# Patient Record
Sex: Female | Born: 1980 | Race: White | Hispanic: No | Marital: Married | Smoking: Never smoker
Health system: Southern US, Community
[De-identification: ages and names within clinical notes are randomized; demographics above are authoritative.]

## PROBLEM LIST (undated history)

## (undated) ENCOUNTER — Inpatient Hospital Stay (HOSPITAL_COMMUNITY): Payer: Self-pay

## (undated) DIAGNOSIS — F32A Depression, unspecified: Secondary | ICD-10-CM

## (undated) DIAGNOSIS — T4145XA Adverse effect of unspecified anesthetic, initial encounter: Secondary | ICD-10-CM

## (undated) DIAGNOSIS — F329 Major depressive disorder, single episode, unspecified: Secondary | ICD-10-CM

## (undated) DIAGNOSIS — T8859XA Other complications of anesthesia, initial encounter: Secondary | ICD-10-CM

---
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
## 2004-10-23 ENCOUNTER — Ambulatory Visit: Payer: Self-pay | Admitting: Psychology

## 2008-07-24 ENCOUNTER — Inpatient Hospital Stay (HOSPITAL_COMMUNITY): Admission: AD | Admit: 2008-07-24 | Discharge: 2008-07-24 | Payer: Self-pay | Admitting: Obstetrics and Gynecology

## 2009-02-07 ENCOUNTER — Inpatient Hospital Stay (HOSPITAL_COMMUNITY): Admission: AD | Admit: 2009-02-07 | Discharge: 2009-02-09 | Payer: Self-pay | Admitting: Obstetrics and Gynecology

## 2010-06-02 ENCOUNTER — Inpatient Hospital Stay (HOSPITAL_COMMUNITY): Admission: AD | Admit: 2010-06-02 | Discharge: 2010-06-03 | Payer: Self-pay | Admitting: Obstetrics & Gynecology

## 2011-02-24 LAB — CBC
HCT: 35 % — ABNORMAL LOW (ref 36.0–46.0)
Hemoglobin: 12 g/dL (ref 12.0–15.0)
MCH: 32.8 pg (ref 26.0–34.0)
MCHC: 34.2 g/dL (ref 30.0–36.0)
MCV: 95.9 fL (ref 78.0–100.0)
Platelets: 115 10*3/uL — ABNORMAL LOW (ref 150–400)
RBC: 3.65 MIL/uL — ABNORMAL LOW (ref 3.87–5.11)
RDW: 12.8 % (ref 11.5–15.5)
WBC: 11 10*3/uL — ABNORMAL HIGH (ref 4.0–10.5)

## 2011-02-24 LAB — RPR: RPR Ser Ql: NONREACTIVE

## 2011-03-21 LAB — RPR: RPR Ser Ql: NONREACTIVE

## 2011-03-21 LAB — CBC
HCT: 37.2 % (ref 36.0–46.0)
Hemoglobin: 12.5 g/dL (ref 12.0–15.0)
MCHC: 33.7 g/dL (ref 30.0–36.0)
MCV: 93.5 fL (ref 78.0–100.0)
MCV: 94.1 fL (ref 78.0–100.0)
Platelets: 112 10*3/uL — ABNORMAL LOW (ref 150–400)
RBC: 3.32 MIL/uL — ABNORMAL LOW (ref 3.87–5.11)
RBC: 3.98 MIL/uL (ref 3.87–5.11)
RDW: 13.8 % (ref 11.5–15.5)
WBC: 9.7 10*3/uL (ref 4.0–10.5)
WBC: 9.9 10*3/uL (ref 4.0–10.5)

## 2014-12-09 NOTE — L&D Delivery Note (Signed)
Pt was admitted for induction. She had AROM with clear fluid. She progressed along a nl labor curve. She pushed for 45 min and had a SVD of one live viable white female infant over an intact perineum. Placenta -S/I. EBL-400cc. Baby to NBN.

## 2015-04-03 LAB — OB RESULTS CONSOLE GC/CHLAMYDIA
CHLAMYDIA, DNA PROBE: NEGATIVE
Gonorrhea: NEGATIVE

## 2015-04-03 LAB — OB RESULTS CONSOLE ABO/RH: RH Type: POSITIVE

## 2015-04-03 LAB — OB RESULTS CONSOLE ANTIBODY SCREEN: Antibody Screen: NEGATIVE

## 2015-04-03 LAB — OB RESULTS CONSOLE RUBELLA ANTIBODY, IGM: RUBELLA: IMMUNE

## 2015-04-03 LAB — OB RESULTS CONSOLE RPR: RPR: NONREACTIVE

## 2015-04-03 LAB — OB RESULTS CONSOLE HIV ANTIBODY (ROUTINE TESTING): HIV: NONREACTIVE

## 2015-04-03 LAB — OB RESULTS CONSOLE HEPATITIS B SURFACE ANTIGEN: HEP B S AG: NEGATIVE

## 2015-04-27 ENCOUNTER — Other Ambulatory Visit (HOSPITAL_COMMUNITY): Payer: Self-pay | Admitting: Obstetrics & Gynecology

## 2015-04-27 DIAGNOSIS — O283 Abnormal ultrasonic finding on antenatal screening of mother: Secondary | ICD-10-CM

## 2015-05-05 ENCOUNTER — Encounter (HOSPITAL_COMMUNITY): Payer: Self-pay

## 2015-05-05 ENCOUNTER — Ambulatory Visit (HOSPITAL_COMMUNITY): Admission: RE | Admit: 2015-05-05 | Payer: BLUE CROSS/BLUE SHIELD | Source: Ambulatory Visit

## 2015-05-05 ENCOUNTER — Ambulatory Visit (HOSPITAL_COMMUNITY)
Admission: RE | Admit: 2015-05-05 | Discharge: 2015-05-05 | Disposition: A | Discharge status: Home / Self Care | Payer: BLUE CROSS/BLUE SHIELD | Source: Ambulatory Visit | Attending: Obstetrics & Gynecology | Admitting: Obstetrics & Gynecology

## 2015-05-05 DIAGNOSIS — O289 Unspecified abnormal findings on antenatal screening of mother: Secondary | ICD-10-CM | POA: Insufficient documentation

## 2015-05-05 DIAGNOSIS — Z3A21 21 weeks gestation of pregnancy: Secondary | ICD-10-CM | POA: Insufficient documentation

## 2015-05-05 DIAGNOSIS — O283 Abnormal ultrasonic finding on antenatal screening of mother: Secondary | ICD-10-CM

## 2015-05-05 DIAGNOSIS — Z3689 Encounter for other specified antenatal screening: Secondary | ICD-10-CM | POA: Insufficient documentation

## 2015-05-05 DIAGNOSIS — O359XX0 Maternal care for (suspected) fetal abnormality and damage, unspecified, not applicable or unspecified: Secondary | ICD-10-CM | POA: Insufficient documentation

## 2015-05-09 ENCOUNTER — Other Ambulatory Visit (HOSPITAL_COMMUNITY): Payer: Self-pay | Admitting: Obstetrics & Gynecology

## 2015-08-11 LAB — OB RESULTS CONSOLE GBS: GBS: NEGATIVE

## 2015-09-11 ENCOUNTER — Inpatient Hospital Stay (HOSPITAL_COMMUNITY)
Admission: AD | Admit: 2015-09-11 | Discharge: 2015-09-11 | Disposition: A | Discharge status: Home / Self Care | Payer: BLUE CROSS/BLUE SHIELD | Source: Ambulatory Visit | Attending: Obstetrics and Gynecology | Admitting: Obstetrics and Gynecology

## 2015-09-11 ENCOUNTER — Encounter (HOSPITAL_COMMUNITY): Payer: Self-pay

## 2015-09-11 DIAGNOSIS — Z3493 Encounter for supervision of normal pregnancy, unspecified, third trimester: Secondary | ICD-10-CM | POA: Insufficient documentation

## 2015-09-11 HISTORY — DX: Depression, unspecified: F32.A

## 2015-09-11 HISTORY — DX: Other complications of anesthesia, initial encounter: T88.59XA

## 2015-09-11 HISTORY — DX: Adverse effect of unspecified anesthetic, initial encounter: T41.45XA

## 2015-09-11 HISTORY — DX: Major depressive disorder, single episode, unspecified: F32.9

## 2015-09-11 NOTE — Progress Notes (Signed)
To Room 174 for Triage

## 2015-09-11 NOTE — Progress Notes (Signed)
Contractions started at 0100.

## 2015-09-14 ENCOUNTER — Telehealth (HOSPITAL_COMMUNITY): Payer: Self-pay | Admitting: *Deleted

## 2015-09-14 ENCOUNTER — Encounter (HOSPITAL_COMMUNITY): Payer: Self-pay | Admitting: *Deleted

## 2015-09-14 NOTE — Telephone Encounter (Signed)
Preadmission screen  

## 2015-09-15 ENCOUNTER — Encounter (HOSPITAL_COMMUNITY): Payer: Self-pay

## 2015-09-15 ENCOUNTER — Inpatient Hospital Stay (HOSPITAL_COMMUNITY): Payer: BLUE CROSS/BLUE SHIELD | Admitting: Anesthesiology

## 2015-09-15 ENCOUNTER — Inpatient Hospital Stay (HOSPITAL_COMMUNITY)
Admission: RE | Admit: 2015-09-15 | Discharge: 2015-09-17 | DRG: 775 | Disposition: A | Discharge status: Home / Self Care | Payer: BLUE CROSS/BLUE SHIELD | Source: Ambulatory Visit | Attending: Obstetrics and Gynecology | Admitting: Obstetrics and Gynecology

## 2015-09-15 DIAGNOSIS — F329 Major depressive disorder, single episode, unspecified: Secondary | ICD-10-CM | POA: Diagnosis present

## 2015-09-15 DIAGNOSIS — Z3480 Encounter for supervision of other normal pregnancy, unspecified trimester: Secondary | ICD-10-CM

## 2015-09-15 DIAGNOSIS — Z3A41 41 weeks gestation of pregnancy: Secondary | ICD-10-CM | POA: Diagnosis not present

## 2015-09-15 DIAGNOSIS — O480 Post-term pregnancy: Principal | ICD-10-CM | POA: Diagnosis not present

## 2015-09-15 DIAGNOSIS — O99344 Other mental disorders complicating childbirth: Secondary | ICD-10-CM | POA: Diagnosis present

## 2015-09-15 LAB — CBC WITH DIFFERENTIAL/PLATELET
Basophils Absolute: 0 10*3/uL (ref 0.0–0.1)
Basophils Relative: 0 %
Eosinophils Absolute: 0 10*3/uL (ref 0.0–0.7)
Eosinophils Relative: 0 %
HCT: 36.2 % (ref 36.0–46.0)
Hemoglobin: 12.2 g/dL (ref 12.0–15.0)
Lymphocytes Relative: 9 %
Lymphs Abs: 1.3 10*3/uL (ref 0.7–4.0)
MCH: 31 pg (ref 26.0–34.0)
MCHC: 33.7 g/dL (ref 30.0–36.0)
MCV: 91.9 fL (ref 78.0–100.0)
Monocytes Absolute: 0.9 10*3/uL (ref 0.1–1.0)
Monocytes Relative: 6 %
Neutro Abs: 12.3 10*3/uL — ABNORMAL HIGH (ref 1.7–7.7)
Neutrophils Relative %: 85 %
Platelets: 111 10*3/uL — ABNORMAL LOW (ref 150–400)
RBC: 3.94 MIL/uL (ref 3.87–5.11)
RDW: 13.6 % (ref 11.5–15.5)
WBC: 14.4 10*3/uL — ABNORMAL HIGH (ref 4.0–10.5)

## 2015-09-15 LAB — CBC
HCT: 35.8 % — ABNORMAL LOW (ref 36.0–46.0)
HEMOGLOBIN: 12.1 g/dL (ref 12.0–15.0)
MCH: 31.1 pg (ref 26.0–34.0)
MCHC: 33.8 g/dL (ref 30.0–36.0)
MCV: 92 fL (ref 78.0–100.0)
PLATELETS: 117 10*3/uL — AB (ref 150–400)
RBC: 3.89 MIL/uL (ref 3.87–5.11)
RDW: 13.7 % (ref 11.5–15.5)
WBC: 8.6 10*3/uL (ref 4.0–10.5)

## 2015-09-15 LAB — TYPE AND SCREEN
ABO/RH(D): O POS
ANTIBODY SCREEN: NEGATIVE

## 2015-09-15 LAB — ABO/RH: ABO/RH(D): O POS

## 2015-09-15 MED ORDER — BENZOCAINE-MENTHOL 20-0.5 % EX AERO: 1.0000 "application " | INHALATION_SPRAY | CUTANEOUS | Status: DC | PRN

## 2015-09-15 MED ORDER — LIDOCAINE HCL (PF) 1 % IJ SOLN
INTRAMUSCULAR | Status: DC | PRN
  Administered 2015-09-15: 5 mL via EPIDURAL
  Administered 2015-09-15: 2 mL via EPIDURAL
  Administered 2015-09-15: 3 mL via EPIDURAL

## 2015-09-15 MED ORDER — EPHEDRINE 5 MG/ML INJ: 10.0000 mg | INTRAVENOUS | Status: DC | PRN

## 2015-09-15 MED ORDER — FENTANYL CITRATE (PF) 100 MCG/2ML IJ SOLN
100.0000 ug | INTRAMUSCULAR | Status: DC | PRN
  Administered 2015-09-15: 100 ug via INTRAVENOUS
  Filled 2015-09-15: qty 2

## 2015-09-15 MED ORDER — IBUPROFEN 600 MG PO TABS
600.0000 mg | ORAL_TABLET | Freq: Four times a day (QID) | ORAL | Status: DC
  Administered 2015-09-15 – 2015-09-16 (×3): 600 mg via ORAL
  Filled 2015-09-15 (×5): qty 1

## 2015-09-15 MED ORDER — OXYTOCIN BOLUS FROM INFUSION: 500.0000 mL | INTRAVENOUS | Status: DC

## 2015-09-15 MED ORDER — LACTATED RINGERS IV SOLN
INTRAVENOUS | Status: DC
  Administered 2015-09-15: 125 mL/h via INTRAVENOUS

## 2015-09-15 MED ORDER — PHENYLEPHRINE 40 MCG/ML (10ML) SYRINGE FOR IV PUSH (FOR BLOOD PRESSURE SUPPORT): 80.0000 ug | PREFILLED_SYRINGE | INTRAVENOUS | Status: DC | PRN

## 2015-09-15 MED ORDER — PRENATAL MULTIVITAMIN CH
1.0000 | ORAL_TABLET | Freq: Every day | ORAL | Status: DC
  Administered 2015-09-15 – 2015-09-16 (×2): 1 via ORAL
  Filled 2015-09-15 (×2): qty 1

## 2015-09-15 MED ORDER — ZOLPIDEM TARTRATE 5 MG PO TABS: 5.0000 mg | ORAL_TABLET | Freq: Every evening | ORAL | Status: DC | PRN

## 2015-09-15 MED ORDER — CITRIC ACID-SODIUM CITRATE 334-500 MG/5ML PO SOLN: 30.0000 mL | ORAL | Status: DC | PRN

## 2015-09-15 MED ORDER — DIPHENHYDRAMINE HCL 50 MG/ML IJ SOLN: 12.5000 mg | INTRAMUSCULAR | Status: DC | PRN

## 2015-09-15 MED ORDER — OXYTOCIN 40 UNITS IN LACTATED RINGERS INFUSION - SIMPLE MED
1.0000 m[IU]/min | INTRAVENOUS | Status: DC
  Administered 2015-09-15: 2 m[IU]/min via INTRAVENOUS

## 2015-09-15 MED ORDER — LACTATED RINGERS IV SOLN: 500.0000 mL | INTRAVENOUS | Status: DC | PRN

## 2015-09-15 MED ORDER — TERBUTALINE SULFATE 1 MG/ML IJ SOLN: 0.2500 mg | Freq: Once | INTRAMUSCULAR | Status: DC | PRN

## 2015-09-15 MED ORDER — SIMETHICONE 80 MG PO CHEW: 80.0000 mg | CHEWABLE_TABLET | ORAL | Status: DC | PRN

## 2015-09-15 MED ORDER — OXYTOCIN 40 UNITS IN LACTATED RINGERS INFUSION - SIMPLE MED
62.5000 mL/h | INTRAVENOUS | Status: DC
  Filled 2015-09-15: qty 1000

## 2015-09-15 MED ORDER — FENTANYL 2.5 MCG/ML BUPIVACAINE 1/10 % EPIDURAL INFUSION (WH - ANES)
INTRAMUSCULAR | Status: AC
  Administered 2015-09-15: 14 mL/h via EPIDURAL
  Filled 2015-09-15: qty 125

## 2015-09-15 MED ORDER — ACETAMINOPHEN 325 MG PO TABS: 650.0000 mg | ORAL_TABLET | ORAL | Status: DC | PRN

## 2015-09-15 MED ORDER — OXYCODONE-ACETAMINOPHEN 5-325 MG PO TABS: 1.0000 | ORAL_TABLET | ORAL | Status: DC | PRN

## 2015-09-15 MED ORDER — MISOPROSTOL 25 MCG QUARTER TABLET
25.0000 ug | ORAL_TABLET | Freq: Once | ORAL | Status: AC
  Administered 2015-09-15: 25 ug via VAGINAL
  Filled 2015-09-15: qty 0.25

## 2015-09-15 MED ORDER — LIDOCAINE HCL (PF) 1 % IJ SOLN
30.0000 mL | INTRAMUSCULAR | Status: DC | PRN
  Filled 2015-09-15: qty 30

## 2015-09-15 MED ORDER — FENTANYL 2.5 MCG/ML BUPIVACAINE 1/10 % EPIDURAL INFUSION (WH - ANES)
14.0000 mL/h | INTRAMUSCULAR | Status: DC | PRN
  Administered 2015-09-15 (×2): 14 mL/h via EPIDURAL

## 2015-09-15 MED ORDER — DIBUCAINE 1 % RE OINT: 1.0000 "application " | TOPICAL_OINTMENT | RECTAL | Status: DC | PRN

## 2015-09-15 MED ORDER — SENNOSIDES-DOCUSATE SODIUM 8.6-50 MG PO TABS
2.0000 | ORAL_TABLET | ORAL | Status: DC
  Administered 2015-09-15 – 2015-09-16 (×2): 2 via ORAL
  Filled 2015-09-15 (×2): qty 2

## 2015-09-15 MED ORDER — MEASLES, MUMPS & RUBELLA VAC ~~LOC~~ INJ: 0.5000 mL | INJECTION | Freq: Once | SUBCUTANEOUS | Status: DC

## 2015-09-15 MED ORDER — PHENYLEPHRINE 40 MCG/ML (10ML) SYRINGE FOR IV PUSH (FOR BLOOD PRESSURE SUPPORT)
PREFILLED_SYRINGE | INTRAVENOUS | Status: AC
  Filled 2015-09-15: qty 20

## 2015-09-15 MED ORDER — TETANUS-DIPHTH-ACELL PERTUSSIS 5-2.5-18.5 LF-MCG/0.5 IM SUSP: 0.5000 mL | Freq: Once | INTRAMUSCULAR | Status: DC

## 2015-09-15 MED ORDER — ONDANSETRON HCL 4 MG/2ML IJ SOLN
4.0000 mg | INTRAMUSCULAR | Status: DC | PRN
Start: 2015-09-15 — End: 2015-09-17

## 2015-09-15 MED ORDER — WITCH HAZEL-GLYCERIN EX PADS: 1.0000 "application " | MEDICATED_PAD | CUTANEOUS | Status: DC | PRN

## 2015-09-15 MED ORDER — OXYCODONE-ACETAMINOPHEN 5-325 MG PO TABS: 2.0000 | ORAL_TABLET | ORAL | Status: DC | PRN

## 2015-09-15 MED ORDER — ONDANSETRON HCL 4 MG/2ML IJ SOLN: 4.0000 mg | Freq: Four times a day (QID) | INTRAMUSCULAR | Status: DC | PRN

## 2015-09-15 MED ORDER — ONDANSETRON HCL 4 MG PO TABS: 4.0000 mg | ORAL_TABLET | ORAL | Status: DC | PRN

## 2015-09-15 NOTE — Anesthesia Preprocedure Evaluation (Addendum)
Anesthesia Evaluation  Patient identified by MRN, date of birth, ID band Patient awake    Reviewed: Allergy & Precautions, NPO status , Patient's Chart, lab work & pertinent test results  History of Anesthesia Complications (+) history of anesthetic complications (patient states last epidural "worked slowly")  Airway Mallampati: II  TM Distance: >3 FB Neck ROM: Full    Dental  (+) Teeth Intact, Dental Advisory Given   Pulmonary neg pulmonary ROS,    Pulmonary exam normal breath sounds clear to auscultation       Cardiovascular Exercise Tolerance: Good negative cardio ROS Normal cardiovascular exam Rhythm:Regular Rate:Normal     Neuro/Psych PSYCHIATRIC DISORDERS Depression negative neurological ROS     GI/Hepatic negative GI ROS, Neg liver ROS,   Endo/Other  negative endocrine ROS  Renal/GU negative Renal ROS     Musculoskeletal negative musculoskeletal ROS (+)   Abdominal   Peds  Hematology negative hematology ROS (+) Blood dyscrasia, anemia , Thrombocytopenia--Platelets 117k on 09/15/15; 115k in 2011    Anesthesia Other Findings Day of surgery medications reviewed with the patient.  Reproductive/Obstetrics (+) Pregnancy                            Anesthesia Physical Anesthesia Plan  ASA: II  Anesthesia Plan: Epidural   Post-op Pain Management:    Induction:   Airway Management Planned:   Additional Equipment:   Intra-op Plan:   Post-operative Plan:   Informed Consent: I have reviewed the patients History and Physical, chart, labs and discussed the procedure including the risks, benefits and alternatives for the proposed anesthesia with the patient or authorized representative who has indicated his/her understanding and acceptance.   Dental advisory given  Plan Discussed with:   Anesthesia Plan Comments: (Patient identified. Risks/Benefits/Options discussed with patient  including but not limited to bleeding, infection, nerve damage, paralysis, failed block, incomplete pain control, headache, blood pressure changes, nausea, vomiting, reactions to medication both or allergic, itching and postpartum back pain. Confirmed with bedside nurse the patient's most recent platelet count. Confirmed with patient that they are not currently taking any anticoagulation, have any bleeding history or any family history of bleeding disorders. Patient expressed understanding and wished to proceed. All questions were answered. )        Anesthesia Quick Evaluation

## 2015-09-15 NOTE — H&P (Signed)
Pt is a 34 y/o white female G3P2002 at 41 weeks who is admitted for an induction. PNC was uncomplicated. PMHX: See hollister PE: VSSAF        HEENT-wnl        Abd-gravid        NST- reactive IMP/IUP at term        Postdates Plan/ Admit

## 2015-09-15 NOTE — Anesthesia Procedure Notes (Signed)
Epidural Patient location during procedure: OB  Staffing Anesthesiologist: Ryelan Kazee EDWARD Performed by: anesthesiologist   Preanesthetic Checklist Completed: patient identified, pre-op evaluation, timeout performed, IV checked, risks and benefits discussed and monitors and equipment checked  Epidural Patient position: sitting Prep: DuraPrep Patient monitoring: blood pressure and continuous pulse ox Approach: midline Location: L3-L4 Injection technique: LOR air  Needle:  Needle type: Tuohy  Needle gauge: 17 G Needle length: 9 cm Needle insertion depth: 6 cm Catheter size: 19 Gauge Catheter at skin depth: 11 cm Test dose: negative and Other (1% Lidocaine)  Additional Notes Patient identified.  Risk benefits discussed including failed block, incomplete pain control, headache, nerve damage, paralysis, blood pressure changes, nausea, vomiting, reactions to medication both toxic or allergic, and postpartum back pain.  Patient expressed understanding and wished to proceed.  All questions were answered.  Sterile technique used throughout procedure and epidural site dressed with sterile barrier dressing. No paresthesia or other complications noted. The patient did not experience any signs of intravascular injection such as tinnitus or metallic taste in mouth nor signs of intrathecal spread such as rapid motor block. Please see nursing notes for vital signs. Reason for block:procedure for pain   

## 2015-09-16 LAB — RPR: RPR Ser Ql: NONREACTIVE

## 2015-09-16 NOTE — Anesthesia Postprocedure Evaluation (Signed)
Anesthesia Post Note  Patient: Sarah Powell  Procedure(s) Performed: * No procedures listed *  Anesthesia type: Epidural  Patient location: Mother/Baby  Post pain: Pain level controlled  Post assessment: Post-op Vital signs reviewed  Last Vitals:  Filed Vitals:   09/16/15 0655  BP: 116/78  Pulse: 75  Temp: 36.5 C  Resp: 16    Post vital signs: Reviewed  Level of consciousness: awake  Complications: No apparent anesthesia complications

## 2015-09-16 NOTE — Lactation Note (Signed)
This note was copied from the chart of Sarah Turkey [place]. Lactation Consultation Note  Patient Name: Sarah Powell Today's Date: 09/16/2015 Reason for consult: Initial assessment Mom reports baby is nursing well, denies questions/concerns. Mom is experienced BF. Lactation brochure left for review, advised of OP services and support group. Encouraged to call for assist if desired.   Maternal Data Has patient been taught Hand Expression?: No (Mom reports she knows how to hand express)  Feeding    LATCH Score/Interventions                      Lactation Tools Discussed/Used WIC Program: No   Consult Status Consult Status: PRN Date: 09/17/15 Follow-up type: In-patient    Alfred Levins 09/16/2015, 2:10 PM

## 2015-09-16 NOTE — Progress Notes (Signed)
CLINICAL SOCIAL WORK MATERNAL/CHILD NOTE  Patient Details  Name: [name] MRN: [id number] Date of Birth: 09/15/2014  Date: 09/16/2015  Clinical Social Worker Initiating Note: Cosimo Schertzer, LCSWDate/ Time Initiated: 09/16/15/1130   Child's Name: Sarah Powell   Legal Guardian:  (Parents Herbie [place] and [place])   Need for Interpreter: None   Date of Referral: 09/15/15   Reason for Referral: Other (Comment)   Referral Source: [facility]   Address: [place], [place] [address]  Phone number:  [phone])   Household Members: Minor Children, Spouse   Natural Supports (not living in the home): Extended Family, Immediate Family   Professional Supports:None   Employment: (Spouse is employed)   Type of Work:     Education:     Printmaker   Other Resources:     Cultural/Religious Considerations Which May Impact Care: none noted  Strengths: Ability to meet basic needs , Home prepared for child    Risk Factors/Current Problems: None   Cognitive State: Alert , Able to Concentrate    Mood/Affect: Happy    CSW Assessment: Acknowledged order for social work consult to assess mother's hx of PP Depression. Met with mother who was pleasant and receptive to social work. Spouse was also present and mother requested that he remain present. Informed that she was treated for PP Depression after the birth of her 33 year old. Mother states that her 30 year old was extremely fussy from birth and she believes that this attributed to the PP Depression. She was reportedly prescribed medication. She notes newborn's temperament is completely opposite and she don't believe that her postpartum experience will be different. She denies any hx of illicit drug use. No acute social concerns noted or reported at this time. 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  CSW Plan/Description:    Discussed signs/symptoms of PP Depression and available resources No further intervention required No barriers to discharge   Jaris Kohles J, LCSW 09/16/2015, 4:00 PM

## 2015-09-16 NOTE — Progress Notes (Signed)
PPD#1 Pt doing well. Lochia wnl. VSSAF IMP/ stable Plan/ routine care. 

## 2015-09-17 NOTE — Progress Notes (Signed)
PPD#2 Pt doing well. Ready for discharge Plan/ Will discharge.

## 2015-09-17 NOTE — Lactation Note (Addendum)
This note was copied from the chart of Sarah Turkey [place]. Lactation Consultation Note  Patient Name: Sarah Powell [id number] Date: 09/17/2015 Reason for consult: Follow-up assessment;Infant weight loss;Breast/nipple pain   Follow up prior to D/C. Infant with 12 BF, formula supplementation x 2 with 7 and 33 cc, 2 voids and 1 stool yesterday at 9 am, and 7 % weight loss. Infant with latch scores of 9 documented. Mom reports she is extremely sore. Bruises noted to both nipples and bleeding has occurred on the left nipple. Assisted mom with latch. Infant did not open wide and lips noted to be curled in. Taught mom how to assess lip flanging and to correct as needed. Assisted mom with positioning and obtaining a deeper latch. Mom said it did feel better but still painful to right breast, she was unable to tolerate infant on left breast. Mom with soft breasts and compressible nipple tissue, nipples are everted with left one larger than right. Mom is using comfort gels and shells between feedings. She was advised to express BM and let air dry to nipples post feed and can  use coconut oil or olive oil on nipples as needed. Mom has Alimentum to take home until [facility] comes in. BM gtts were easily expressed. Enc mom to feed 8-12 x in 24 hours at first feeding cues. Mom has a DEBP at home and informed her that if unable to latch infant due to nipple soreness, to pump for 15 minutes q 2-3 hours  and supplement baby with EBM 1st followed by formula if needed with amounts per Feeding amount handout based on day of age at least every 3 hours. Mom to have F/U Tuesday with Pediatrician. LC handout reviewed, reiterated OP appts, Phone Calls, Support Groups, and BF Resources. Referred mom to BF Information in Taking Care of Baby and me and encouraged them to keep I/O record and take to Ped. Visit. Enc mom to call with questions/concerns and to call OP LC services for consult if nipple soreness worsens or does not improve after  1 week of age.   Maternal Data Formula Feeding for Exclusion: No Has patient been taught Hand Expression?: Yes Does the patient have breastfeeding experience prior to this delivery?: Yes  Feeding Feeding Type: Breast Fed Length of feed: 15 min  LATCH Score/Interventions Latch: Repeated attempts needed to sustain latch, nipple held in mouth throughout feeding, stimulation needed to elicit sucking reflex. Intervention(s): Adjust position;Assist with latch;Breast massage;Breast compression  Audible Swallowing: Spontaneous and intermittent  Type of Nipple: Everted at rest and after stimulation  Comfort (Breast/Nipple): Engorged, cracked, bleeding, large blisters, severe discomfort Problem noted: Cracked, bleeding, blisters, bruises Intervention(s): Expressed breast milk to nipple  Problem noted: Severe discomfort Interventions (Mild/moderate discomfort): Hand massage;Hand expression;Comfort gels (Breast Shells) Interventions (Severe discomfort): Double electric pum  Hold (Positioning): Assistance needed to correctly position infant at breast and maintain latch. Intervention(s): Breastfeeding basics reviewed;Support Pillows;Position options;Skin to skin  LATCH Score: 6  Lactation Tools Discussed/Used Tools: Shells;Comfort gels Shell Type: Inverted WIC Program: No Pump Review: Setup, frequency, and cleaning;Milk Storage Initiated by:: Noralee Stain, RN Date initiated:: 09/17/15   Consult Status Consult Status: PRN Follow-up type: Call as needed    Sarah Powell Sarah Powell 09/17/2015, 9:25 AM

## 2015-09-17 NOTE — Discharge Summary (Signed)
Obstetric Discharge Summary Reason for Admission: induction of labor Prenatal Procedures: NST and ultrasound Intrapartum Procedures: spontaneous vaginal delivery Postpartum Procedures: none Complications-Operative and Postpartum: none HEMOGLOBIN  Date Value Ref Range Status  09/15/2015 12.2 12.0 - 15.0 g/dL Final   HCT  Date Value Ref Range Status  09/15/2015 36.2 36.0 - 46.0 % Final    Physical Exam:  General: alert Lochia: appropriate Uterine Fundus: firm   Discharge Diagnoses: Post-date pregnancy  Discharge Information: Date: 09/17/2015 Activity: pelvic rest Diet: routine Medications: PNV and Ibuprofen Condition: stable Instructions: refer to practice specific booklet Discharge to: home Follow-up Information    Follow up with Levi Aland, MD. Schedule an appointment as soon as possible for a visit in 1 month.   Specialty:  Obstetrics and Gynecology   Contact information:   [address] [place] [address] [phone]       Newborn Data: Live born female  Birth Weight: 7 lb 9.2 oz (3436 g) APGAR: 9, 9  Home with mother.  Jacy Howat E 09/17/2015, 10:50 AM

## 2016-07-12 ENCOUNTER — Emergency Department (HOSPITAL_COMMUNITY)
Admission: EM | Admit: 2016-07-12 | Discharge: 2016-07-12 | Disposition: A | Discharge status: Home / Self Care | Payer: Medicaid Other | Attending: Emergency Medicine | Admitting: Emergency Medicine

## 2016-07-12 ENCOUNTER — Emergency Department (HOSPITAL_COMMUNITY): Payer: Medicaid Other

## 2016-07-12 ENCOUNTER — Encounter (HOSPITAL_COMMUNITY): Payer: Self-pay | Admitting: Emergency Medicine

## 2016-07-12 DIAGNOSIS — Y9289 Other specified places as the place of occurrence of the external cause: Secondary | ICD-10-CM | POA: Diagnosis not present

## 2016-07-12 DIAGNOSIS — S60221A Contusion of right hand, initial encounter: Secondary | ICD-10-CM | POA: Diagnosis not present

## 2016-07-12 DIAGNOSIS — Y9301 Activity, walking, marching and hiking: Secondary | ICD-10-CM | POA: Insufficient documentation

## 2016-07-12 DIAGNOSIS — S6991XA Unspecified injury of right wrist, hand and finger(s), initial encounter: Secondary | ICD-10-CM | POA: Diagnosis present

## 2016-07-12 DIAGNOSIS — Y999 Unspecified external cause status: Secondary | ICD-10-CM | POA: Insufficient documentation

## 2016-07-12 DIAGNOSIS — W1839XA Other fall on same level, initial encounter: Secondary | ICD-10-CM | POA: Insufficient documentation

## 2016-07-12 DIAGNOSIS — M79641 Pain in right hand: Secondary | ICD-10-CM

## 2016-07-12 NOTE — Discharge Instructions (Signed)
Her x-rays today are negative for any fracture. Continue to take over-the-counter pain relievers as needed. He may also apply ice to the area. Follow-up with hand surgery for persistent pain.

## 2016-07-12 NOTE — ED Notes (Signed)
Patient able to ambulate independently  

## 2016-07-12 NOTE — ED Provider Notes (Signed)
[facility]-EMERGENCY DEPT Provider Note   CSN: [id number] Arrival date & time: 07/12/16  1707  First Provider Contact:   First MD Initiated Contact with Patient 07/12/16 1835     By signing my name below, I, [name], attest that this documentation has been prepared under the direction and in the presence of Cheri Fowler, PA-C. Electronically Signed: Angelene Giovanni, ED Scribe. 07/12/16. 7:00 PM.    History   Chief Complaint Chief Complaint  Patient presents with  . Hand Pain    HPI Comments: Sarah Powell is a 35 y.o. female who presents to the Emergency Department complaining of sudden onset of moderate pain to the palmar aspect of her right hand s/p fall that occurred 5 days ago. Pt explains that she was hiking at [employer] with her baby on her back when she tripped and fell, falling on outstretched arm unto a rock. No LOC or head injuries. No alleviating factors noted. Pt has tried Aleve intermittently with no relief. No fever, chills, generalized rash, numbness, weakness, or any open wounds.    The history is provided by the patient. No language interpreter was used.    Past Medical History:  Diagnosis Date  . Complication of anesthesia   . 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 Encounter for supervision of normal pregnancy in multigravida 09/15/2015  . [redacted] weeks gestation of pregnancy   . Encounter for fetal anatomic survey   . Abnormal fetal ultrasound   . Fetal abnormality suspected, antepartum condition     Past Surgical History:  Procedure Laterality Date  . NO PAST SURGERIES      OB History    Gravida Para Term Preterm AB Living   SAB TAB Ectopic Multiple Live Births         0 3       Home Medications    Prior to Admission medications   Medication Sig Start Date End Date Taking? Authorizing Provider  Prenatal Vit-Fe Fumarate-FA (PRENATAL MULTIVITAMIN)  TABS tablet Take 1 tablet by mouth at bedtime.    Historical Provider, MD    Family History Family History  Problem Relation Age of Onset  . Turner syndrome Other     Social History Social History  Substance Use Topics  . Smoking status: Never Smoker  . Smokeless tobacco: Not on file  . Alcohol use No     Allergies   Morphine and related   Review of Systems Review of Systems  Constitutional: Negative for chills and fever.  Musculoskeletal: Positive for myalgias.  Skin: Negative for rash and wound.  Neurological: Negative for weakness and numbness.     Physical Exam Updated Vital Signs BP 120/59 (BP Location: Right Arm)   Pulse 73   Temp 98.3 F (36.8 C) (Oral)   Resp 12   SpO2 100%   Physical Exam  Constitutional: She is oriented to person, place, and time. She appears well-developed and well-nourished.  HENT:  Head: Normocephalic and atraumatic.  Right Ear: External ear normal.  Left Ear: External ear normal.  Eyes: Conjunctivae are normal. No scleral icterus.  Neck: No tracheal deviation present.  Cardiovascular:  Pulses:      Radial pulses are 2+ on the right side, and 2+ on the left side.  Brisk capillary refill.   Pulmonary/Chest: Effort normal. No respiratory distress.  Abdominal:  She exhibits no distension.  Musculoskeletal: Normal range of motion. She exhibits edema and tenderness.  FAROM of right wrist and all fingers without difficulty.  Mild TTP along thenar eminence.  No anatomical snuffbox tenderness.  No distal ulna or radius tenderness.  No dorsal MCP tenderness.  Neurological: She is alert and oriented to person, place, and time.  Skin: Skin is warm and dry.  Old bruising to right thenar eminence.  Skin intact.   Psychiatric: She has a normal mood and affect. Her behavior is normal.     ED Treatments / Results  DIAGNOSTIC STUDIES: Oxygen Saturation is 98% on RA, normal by my interpretation.    COORDINATION OF CARE: 6:59 PM- Pt  advised of plan for treatment and pt agrees. Pt informed of her x-ray results. Will provide resources for Ortho follow up if pain persists.    Radiology Dg Hand Complete Right  Result Date: 07/12/2016 CLINICAL DATA:  Anterior right hand pain centered at the base of the thumb with bruising and swelling since the patient suffered a blow to the right hand by a rock while hiking. Initial encounter. EXAM: RIGHT HAND - COMPLETE 3+ VIEW COMPARISON:  None. FINDINGS: There is no evidence of fracture or dislocation. There is no evidence of arthropathy or other focal bone abnormality. Soft tissues are unremarkable. IMPRESSION: Negative exam. Electronically Signed   By: Drusilla Kanner M.D.   On: 07/12/2016 17:58    Procedures Procedures (including critical care time)  Medications Ordered in ED Medications - No data to display   Initial Impression / Assessment and Plan / ED Course  Cheri Fowler, PA-C has reviewed the triage vital signs and the nursing notes.  Pertinent labs & imaging results that were available during my care of the patient were reviewed by me and considered in my medical decision making (see chart for details).  Clinical Course   Patient presents with right hand contusion and right hand pain after falling in a rock 5 days ago. No head injuries. She is neurovascularly intact. No anatomical snuffbox tenderness. Mild tenderness over the right thenar eminence. Plain films negative for fracture. Recommend over-the-counter pain relievers. Ice as needed. Follow up with hand surgery for persistent symptoms. Return precautions discussed. Patient agrees and acknowledges the above plan for discharge.  Final Clinical Impressions(s) / ED Diagnoses   Final diagnoses:  Hand contusion, right, initial encounter  Hand pain, right    New Prescriptions New Prescriptions   No medications on file   I personally performed the services described in this documentation, which was scribed in my presence.  The recorded information has been reviewed and is accurate.     Cheri Fowler, PA-C 07/12/16 1905    Mancel Bale, MD 07/13/16 [phone]

## 2016-07-12 NOTE — ED Triage Notes (Signed)
Pt sts left hand pain since falling and catching self last week; bruising noted

## 2018-02-26 ENCOUNTER — Encounter (HOSPITAL_COMMUNITY): Payer: Self-pay | Admitting: Emergency Medicine

## 2018-02-26 ENCOUNTER — Other Ambulatory Visit: Payer: Self-pay

## 2018-02-26 ENCOUNTER — Ambulatory Visit: Payer: Self-pay | Admitting: Student

## 2018-02-26 ENCOUNTER — Inpatient Hospital Stay (HOSPITAL_COMMUNITY)
Admission: EM | Admit: 2018-02-26 | Discharge: 2018-02-28 | DRG: 494 | Disposition: A | Discharge status: Home / Self Care | Payer: Medicaid Other | Attending: Student | Admitting: Student

## 2018-02-26 DIAGNOSIS — Y9351 Activity, roller skating (inline) and skateboarding: Secondary | ICD-10-CM

## 2018-02-26 DIAGNOSIS — S82241A Displaced spiral fracture of shaft of right tibia, initial encounter for closed fracture: Secondary | ICD-10-CM | POA: Diagnosis present

## 2018-02-26 DIAGNOSIS — Z885 Allergy status to narcotic agent status: Secondary | ICD-10-CM

## 2018-02-26 DIAGNOSIS — S82201A Unspecified fracture of shaft of right tibia, initial encounter for closed fracture: Secondary | ICD-10-CM | POA: Insufficient documentation

## 2018-02-26 DIAGNOSIS — W010XXA Fall on same level from slipping, tripping and stumbling without subsequent striking against object, initial encounter: Secondary | ICD-10-CM | POA: Diagnosis present

## 2018-02-26 DIAGNOSIS — Z419 Encounter for procedure for purposes other than remedying health state, unspecified: Secondary | ICD-10-CM

## 2018-02-26 DIAGNOSIS — Z01818 Encounter for other preprocedural examination: Secondary | ICD-10-CM

## 2018-02-26 DIAGNOSIS — S82401A Unspecified fracture of shaft of right fibula, initial encounter for closed fracture: Secondary | ICD-10-CM

## 2018-02-26 DIAGNOSIS — T148XXA Other injury of unspecified body region, initial encounter: Secondary | ICD-10-CM

## 2018-02-26 DIAGNOSIS — M79661 Pain in right lower leg: Secondary | ICD-10-CM | POA: Diagnosis present

## 2018-02-26 LAB — CBC WITH DIFFERENTIAL/PLATELET
BASOS PCT: 0 %
Basophils Absolute: 0 10*3/uL (ref 0.0–0.1)
Eosinophils Absolute: 0 10*3/uL (ref 0.0–0.7)
Eosinophils Relative: 0 %
HEMATOCRIT: 43.1 % (ref 36.0–46.0)
Hemoglobin: 14.3 g/dL (ref 12.0–15.0)
Lymphocytes Relative: 10 %
Lymphs Abs: 1.7 10*3/uL (ref 0.7–4.0)
MCH: 29.7 pg (ref 26.0–34.0)
MCHC: 33.2 g/dL (ref 30.0–36.0)
MCV: 89.6 fL (ref 78.0–100.0)
MONO ABS: 0.9 10*3/uL (ref 0.1–1.0)
Monocytes Relative: 5 %
NEUTROS ABS: 14.1 10*3/uL — AB (ref 1.7–7.7)
NEUTROS PCT: 85 %
PLATELETS: 209 10*3/uL (ref 150–400)
RBC: 4.81 MIL/uL (ref 3.87–5.11)
RDW: 14 % (ref 11.5–15.5)
WBC: 16.6 10*3/uL — AB (ref 4.0–10.5)

## 2018-02-26 LAB — I-STAT BETA HCG BLOOD, ED (MC, WL, AP ONLY): I-stat hCG, quantitative: <5 m[IU]/mL (ref <5)

## 2018-02-26 LAB — COMPREHENSIVE METABOLIC PANEL
ALT: 18 U/L (ref 14–54)
AST: 19 U/L (ref 15–41)
Albumin: 4.8 g/dL (ref 3.5–5.0)
Alkaline Phosphatase: 41 U/L (ref 38–126)
Anion gap: 12 (ref 5–15)
BILIRUBIN TOTAL: 0.8 mg/dL (ref 0.3–1.2)
BUN: 15 mg/dL (ref 6–20)
CHLORIDE: 103 mmol/L (ref 101–111)
CO2: 23 mmol/L (ref 22–32)
Calcium: 9.9 mg/dL (ref 8.9–10.3)
Creatinine, Ser: 0.81 mg/dL (ref 0.44–1.00)
GFR calc Af Amer: >60 mL/min (ref >60)
GFR calc non Af Amer: >60 mL/min (ref >60)
Glucose, Bld: 101 mg/dL — ABNORMAL HIGH (ref 65–99)
POTASSIUM: 3.4 mmol/L — AB (ref 3.5–5.1)
Sodium: 138 mmol/L (ref 135–145)
Total Protein: 7.5 g/dL (ref 6.5–8.1)

## 2018-02-26 LAB — SAMPLE TO BLOOD BANK

## 2018-02-26 MED ORDER — OXYCODONE HCL 5 MG PO TABS
5.0000 mg | ORAL_TABLET | ORAL | Status: DC | PRN
  Administered 2018-02-26 (×2): 5 mg via ORAL
  Filled 2018-02-26 (×2): qty 1

## 2018-02-26 MED ORDER — MORPHINE SULFATE (PF) 4 MG/ML IV SOLN: 1.0000 mg | INTRAVENOUS | Status: DC | PRN

## 2018-02-26 MED ORDER — DOCUSATE SODIUM 100 MG PO CAPS
100.0000 mg | ORAL_CAPSULE | Freq: Two times a day (BID) | ORAL | Status: DC
  Administered 2018-02-26: 100 mg via ORAL
  Filled 2018-02-26: qty 1

## 2018-02-26 MED ORDER — CHLORHEXIDINE GLUCONATE 4 % EX LIQD
60.0000 mL | Freq: Once | CUTANEOUS | Status: AC
  Administered 2018-02-26: 4 via TOPICAL
  Filled 2018-02-26: qty 60

## 2018-02-26 MED ORDER — LACTATED RINGERS IV SOLN
INTRAVENOUS | Status: DC
  Administered 2018-02-26: 23:00:00 via INTRAVENOUS

## 2018-02-26 MED ORDER — ACETAMINOPHEN 325 MG PO TABS: 650.0000 mg | ORAL_TABLET | Freq: Four times a day (QID) | ORAL | Status: DC | PRN

## 2018-02-26 MED ORDER — POLYETHYLENE GLYCOL 3350 17 G PO PACK: 17.0000 g | PACK | Freq: Every day | ORAL | Status: DC

## 2018-02-26 MED ORDER — CEFAZOLIN SODIUM-DEXTROSE 2-4 GM/100ML-% IV SOLN
2.0000 g | INTRAVENOUS | Status: AC
  Administered 2018-02-27: 2 g via INTRAVENOUS
  Filled 2018-02-26 (×2): qty 100

## 2018-02-26 MED ORDER — ACETAMINOPHEN 500 MG PO TABS
1000.0000 mg | ORAL_TABLET | Freq: Once | ORAL | Status: AC
  Administered 2018-02-26: 1000 mg via ORAL
  Filled 2018-02-26: qty 2

## 2018-02-26 MED ORDER — METHOCARBAMOL 500 MG PO TABS
500.0000 mg | ORAL_TABLET | Freq: Four times a day (QID) | ORAL | Status: DC | PRN
  Administered 2018-02-26: 500 mg via ORAL
  Filled 2018-02-26: qty 1

## 2018-02-26 MED ORDER — METHOCARBAMOL 1000 MG/10ML IJ SOLN: 1000.0000 mg | Freq: Four times a day (QID) | INTRAMUSCULAR | Status: DC | PRN

## 2018-02-26 MED ORDER — ACETAMINOPHEN 650 MG RE SUPP: 650.0000 mg | Freq: Four times a day (QID) | RECTAL | Status: DC | PRN

## 2018-02-26 MED ORDER — GABAPENTIN 300 MG PO CAPS
300.0000 mg | ORAL_CAPSULE | Freq: Once | ORAL | Status: AC
  Administered 2018-02-26: 300 mg via ORAL
  Filled 2018-02-26: qty 1

## 2018-02-26 NOTE — Anesthesia Preprocedure Evaluation (Addendum)
Anesthesia Evaluation  Patient identified by MRN, date of birth, ID band Patient awake    Reviewed: Allergy & Precautions, NPO status , Patient's Chart, lab work & pertinent test results  History of Anesthesia Complications (+) history of anesthetic complications  Airway Mallampati: II  TM Distance: >3 FB Neck ROM: Full    Dental no notable dental hx.    Pulmonary neg pulmonary ROS,    Pulmonary exam normal breath sounds clear to auscultation       Cardiovascular negative cardio ROS Normal cardiovascular exam Rhythm:Regular Rate:Normal     Neuro/Psych PSYCHIATRIC DISORDERS Depression negative neurological ROS     GI/Hepatic negative GI ROS, Neg liver ROS,   Endo/Other  negative endocrine ROS  Renal/GU negative Renal ROS  negative genitourinary   Musculoskeletal negative musculoskeletal ROS (+)   Abdominal   Peds negative pediatric ROS (+)  Hematology negative hematology ROS (+)   Anesthesia Other Findings   Reproductive/Obstetrics negative OB ROS                             Anesthesia Physical Anesthesia Plan  ASA: II  Anesthesia Plan: General   Post-op Pain Management:    Induction: Intravenous  PONV Risk Score and Plan: 2 and Ondansetron, Dexamethasone, Treatment may vary due to age or medical condition and Scopolamine patch - Pre-op  Airway Management Planned: LMA and Oral ETT  Additional Equipment:   Intra-op Plan:   Post-operative Plan: Extubation in OR  Informed Consent: I have reviewed the patients History and Physical, chart, labs and discussed the procedure including the risks, benefits and alternatives for the proposed anesthesia with the patient or authorized representative who has indicated his/her understanding and acceptance.     Plan Discussed with: CRNA, Surgeon and Anesthesiologist  Anesthesia Plan Comments: ( )       Anesthesia Quick  Evaluation

## 2018-02-26 NOTE — ED Triage Notes (Signed)
Patient fell while skating this evening , [facility] x[facility] result shows right tib/fib fracture - splint applied at clinic .

## 2018-02-26 NOTE — Progress Notes (Addendum)
Right tibia fracture   Low velocity injury. Seen at urgent care and placed in long leg splint. Unable to manage at home and admitted for observation with plan for early definitive management with IM nailing by Dr. Truitt MerleKevin Haddix tomorrow am and subsequent work with PT.  Labs to be drawn. Full H&P to follow in the am.  Ice, elevate, pain control, NPO p MN. Dr. Jena GaussHaddix to see in the am.  Sarah GalasMichael Jodiann Ognibene, MD [facility], [facility] [phone] (p)

## 2018-02-27 ENCOUNTER — Inpatient Hospital Stay (HOSPITAL_COMMUNITY): Payer: Medicaid Other

## 2018-02-27 ENCOUNTER — Inpatient Hospital Stay (HOSPITAL_COMMUNITY): Payer: Medicaid Other | Admitting: Anesthesiology

## 2018-02-27 ENCOUNTER — Encounter (HOSPITAL_COMMUNITY)
Admission: EM | Disposition: A | Discharge status: Home / Self Care | Payer: Self-pay | Source: Home / Self Care | Attending: Student

## 2018-02-27 ENCOUNTER — Inpatient Hospital Stay: Admit: 2018-02-27 | Payer: BLUE CROSS/BLUE SHIELD | Admitting: Student

## 2018-02-27 ENCOUNTER — Encounter (HOSPITAL_COMMUNITY): Payer: Self-pay | Admitting: Critical Care Medicine

## 2018-02-27 HISTORY — PX: TIBIA IM NAIL INSERTION: SHX2516

## 2018-02-27 LAB — COMPREHENSIVE METABOLIC PANEL
ALT: 13 U/L — ABNORMAL LOW (ref 14–54)
AST: 17 U/L (ref 15–41)
Albumin: 3.9 g/dL (ref 3.5–5.0)
Alkaline Phosphatase: 35 U/L — ABNORMAL LOW (ref 38–126)
Anion gap: 9 (ref 5–15)
BUN: 10 mg/dL (ref 6–20)
CHLORIDE: 105 mmol/L (ref 101–111)
CO2: 25 mmol/L (ref 22–32)
Calcium: 9.4 mg/dL (ref 8.9–10.3)
Creatinine, Ser: 0.75 mg/dL (ref 0.44–1.00)
GFR calc non Af Amer: >60 mL/min (ref >60)
Glucose, Bld: 106 mg/dL — ABNORMAL HIGH (ref 65–99)
POTASSIUM: 3.7 mmol/L (ref 3.5–5.1)
SODIUM: 139 mmol/L (ref 135–145)
Total Bilirubin: 0.8 mg/dL (ref 0.3–1.2)
Total Protein: 6.4 g/dL — ABNORMAL LOW (ref 6.5–8.1)

## 2018-02-27 LAB — URINALYSIS, ROUTINE W REFLEX MICROSCOPIC
Bilirubin Urine: NEGATIVE
Glucose, UA: NEGATIVE mg/dL
Hgb urine dipstick: NEGATIVE
KETONES UR: NEGATIVE mg/dL
LEUKOCYTES UA: NEGATIVE
NITRITE: NEGATIVE
PROTEIN: NEGATIVE mg/dL
Specific Gravity, Urine: 1.006 (ref 1.005–1.030)
pH: 5 (ref 5.0–8.0)

## 2018-02-27 LAB — CBC WITH DIFFERENTIAL/PLATELET
BASOS ABS: 0 10*3/uL (ref 0.0–0.1)
Basophils Relative: 0 %
EOS ABS: 0 10*3/uL (ref 0.0–0.7)
EOS PCT: 0 %
HCT: 39.7 % (ref 36.0–46.0)
Hemoglobin: 13 g/dL (ref 12.0–15.0)
LYMPHS PCT: 30 %
Lymphs Abs: 2.7 10*3/uL (ref 0.7–4.0)
MCH: 29.4 pg (ref 26.0–34.0)
MCHC: 32.7 g/dL (ref 30.0–36.0)
MCV: 89.8 fL (ref 78.0–100.0)
MONO ABS: 0.5 10*3/uL (ref 0.1–1.0)
Monocytes Relative: 6 %
Neutro Abs: 5.8 10*3/uL (ref 1.7–7.7)
Neutrophils Relative %: 64 %
PLATELETS: 196 10*3/uL (ref 150–400)
RBC: 4.42 MIL/uL (ref 3.87–5.11)
RDW: 14.1 % (ref 11.5–15.5)
WBC: 9.1 10*3/uL (ref 4.0–10.5)

## 2018-02-27 LAB — SURGICAL PCR SCREEN
MRSA, PCR: NEGATIVE
STAPHYLOCOCCUS AUREUS: NEGATIVE

## 2018-02-27 LAB — APTT: aPTT: 32 seconds (ref 24–36)

## 2018-02-27 LAB — PROTIME-INR
INR: 0.97
Prothrombin Time: 12.8 seconds (ref 11.4–15.2)

## 2018-02-27 SURGERY — INSERTION, INTRAMEDULLARY ROD, TIBIA
Anesthesia: General | Laterality: Right

## 2018-02-27 MED ORDER — SCOPOLAMINE 1 MG/3DAYS TD PT72
MEDICATED_PATCH | TRANSDERMAL | Status: DC | PRN
  Administered 2018-02-27: 1 via TRANSDERMAL

## 2018-02-27 MED ORDER — VANCOMYCIN HCL 1000 MG IV SOLR
INTRAVENOUS | Status: DC | PRN
  Administered 2018-02-27: 1000 mg via TOPICAL

## 2018-02-27 MED ORDER — 0.9 % SODIUM CHLORIDE (POUR BTL) OPTIME
TOPICAL | Status: DC | PRN
  Administered 2018-02-27: 1000 mL

## 2018-02-27 MED ORDER — HYDROMORPHONE HCL 1 MG/ML IJ SOLN
0.5000 mg | INTRAMUSCULAR | Status: DC | PRN
  Administered 2018-02-27 (×3): 1 mg via INTRAVENOUS
  Filled 2018-02-27 (×3): qty 1

## 2018-02-27 MED ORDER — DEXAMETHASONE SODIUM PHOSPHATE 10 MG/ML IJ SOLN
INTRAMUSCULAR | Status: DC | PRN
  Administered 2018-02-27: 8 mg via INTRAVENOUS

## 2018-02-27 MED ORDER — BACITRACIN ZINC 500 UNIT/GM EX OINT
TOPICAL_OINTMENT | CUTANEOUS | Status: AC
  Filled 2018-02-27: qty 28.35

## 2018-02-27 MED ORDER — METHOCARBAMOL 750 MG PO TABS: 750.0000 mg | ORAL_TABLET | Freq: Four times a day (QID) | ORAL | 0 refills | Status: DC

## 2018-02-27 MED ORDER — HYDROMORPHONE HCL 1 MG/ML IJ SOLN
0.2500 mg | INTRAMUSCULAR | Status: DC | PRN
  Administered 2018-02-27 (×3): 0.5 mg via INTRAVENOUS

## 2018-02-27 MED ORDER — LIDOCAINE HCL (CARDIAC) 20 MG/ML IV SOLN
INTRAVENOUS | Status: AC
  Filled 2018-02-27: qty 10

## 2018-02-27 MED ORDER — LACTATED RINGERS IV SOLN
INTRAVENOUS | Status: DC | PRN
  Administered 2018-02-26 – 2018-02-27 (×2): via INTRAVENOUS

## 2018-02-27 MED ORDER — PHENYLEPHRINE 40 MCG/ML (10ML) SYRINGE FOR IV PUSH (FOR BLOOD PRESSURE SUPPORT)
PREFILLED_SYRINGE | INTRAVENOUS | Status: AC
  Filled 2018-02-27: qty 10

## 2018-02-27 MED ORDER — FENTANYL CITRATE (PF) 100 MCG/2ML IJ SOLN
25.0000 ug | INTRAMUSCULAR | Status: DC | PRN
  Administered 2018-02-27 (×2): 50 ug via INTRAVENOUS

## 2018-02-27 MED ORDER — HYDROMORPHONE HCL 1 MG/ML IJ SOLN
INTRAMUSCULAR | Status: AC
  Administered 2018-02-27: 11:00:00
  Filled 2018-02-27: qty 1

## 2018-02-27 MED ORDER — CEFAZOLIN SODIUM-DEXTROSE 2-4 GM/100ML-% IV SOLN
2.0000 g | Freq: Three times a day (TID) | INTRAVENOUS | Status: AC
  Administered 2018-02-27 – 2018-02-28 (×3): 2 g via INTRAVENOUS
  Filled 2018-02-27 (×3): qty 100

## 2018-02-27 MED ORDER — MIDAZOLAM HCL 5 MG/5ML IJ SOLN
INTRAMUSCULAR | Status: DC | PRN
  Administered 2018-02-27: 2 mg via INTRAVENOUS

## 2018-02-27 MED ORDER — PROPOFOL 10 MG/ML IV BOLUS
INTRAVENOUS | Status: DC | PRN
  Administered 2018-02-27 (×2): 10 mg via INTRAVENOUS
  Administered 2018-02-27: 120 mg via INTRAVENOUS

## 2018-02-27 MED ORDER — POTASSIUM CHLORIDE CRYS ER 20 MEQ PO TBCR
40.0000 meq | EXTENDED_RELEASE_TABLET | Freq: Once | ORAL | Status: AC
  Administered 2018-02-27: 40 meq via ORAL
  Filled 2018-02-27: qty 2

## 2018-02-27 MED ORDER — FENTANYL CITRATE (PF) 100 MCG/2ML IJ SOLN
INTRAMUSCULAR | Status: AC
  Administered 2018-02-27: 10:00:00
  Filled 2018-02-27: qty 2

## 2018-02-27 MED ORDER — EPHEDRINE 5 MG/ML INJ
INTRAVENOUS | Status: AC
  Filled 2018-02-27: qty 10

## 2018-02-27 MED ORDER — ASPIRIN EC 325 MG PO TBEC: 325.0000 mg | DELAYED_RELEASE_TABLET | Freq: Every day | ORAL | 0 refills | Status: DC

## 2018-02-27 MED ORDER — OXYCODONE HCL 5 MG/5ML PO SOLN: 5.0000 mg | Freq: Once | ORAL | Status: DC | PRN

## 2018-02-27 MED ORDER — DEXAMETHASONE SODIUM PHOSPHATE 10 MG/ML IJ SOLN
INTRAMUSCULAR | Status: AC
  Filled 2018-02-27: qty 1

## 2018-02-27 MED ORDER — SUGAMMADEX SODIUM 200 MG/2ML IV SOLN
INTRAVENOUS | Status: DC | PRN
  Administered 2018-02-27: 170 mg via INTRAVENOUS

## 2018-02-27 MED ORDER — ASPIRIN EC 325 MG PO TBEC: 325.0000 mg | DELAYED_RELEASE_TABLET | Freq: Every day | ORAL | 0 refills | Status: AC

## 2018-02-27 MED ORDER — LACTATED RINGERS IV SOLN
INTRAVENOUS | Status: DC
  Administered 2018-02-27 – 2018-02-28 (×3): via INTRAVENOUS

## 2018-02-27 MED ORDER — HYDROMORPHONE HCL 1 MG/ML IJ SOLN
INTRAMUSCULAR | Status: AC
  Administered 2018-02-27: 0.5 mg via INTRAVENOUS
  Filled 2018-02-27: qty 1

## 2018-02-27 MED ORDER — PROPOFOL 10 MG/ML IV BOLUS
INTRAVENOUS | Status: AC
  Filled 2018-02-27: qty 20

## 2018-02-27 MED ORDER — FENTANYL CITRATE (PF) 250 MCG/5ML IJ SOLN
INTRAMUSCULAR | Status: AC
  Filled 2018-02-27: qty 5

## 2018-02-27 MED ORDER — OXYCODONE-ACETAMINOPHEN 5-325 MG PO TABS: 1.0000 | ORAL_TABLET | ORAL | 0 refills | Status: DC | PRN

## 2018-02-27 MED ORDER — ACETAMINOPHEN 325 MG PO TABS: 325.0000 mg | ORAL_TABLET | ORAL | Status: DC | PRN

## 2018-02-27 MED ORDER — ROCURONIUM BROMIDE 10 MG/ML (PF) SYRINGE
PREFILLED_SYRINGE | INTRAVENOUS | Status: DC | PRN
  Administered 2018-02-27: 50 mg via INTRAVENOUS

## 2018-02-27 MED ORDER — OXYCODONE HCL 5 MG PO TABS: 5.0000 mg | ORAL_TABLET | Freq: Once | ORAL | Status: DC | PRN

## 2018-02-27 MED ORDER — KETOROLAC TROMETHAMINE 30 MG/ML IJ SOLN: 30.0000 mg | Freq: Once | INTRAMUSCULAR | Status: DC | PRN

## 2018-02-27 MED ORDER — KETOROLAC TROMETHAMINE 30 MG/ML IJ SOLN
INTRAMUSCULAR | Status: AC
  Filled 2018-02-27: qty 1

## 2018-02-27 MED ORDER — OXYCODONE-ACETAMINOPHEN 5-325 MG PO TABS
1.0000 | ORAL_TABLET | Freq: Four times a day (QID) | ORAL | Status: DC | PRN
  Administered 2018-02-27: 1 via ORAL
  Administered 2018-02-28: 2 via ORAL
  Filled 2018-02-27: qty 2
  Filled 2018-02-27 (×2): qty 1

## 2018-02-27 MED ORDER — KETOROLAC TROMETHAMINE 30 MG/ML IJ SOLN
INTRAMUSCULAR | Status: DC | PRN
  Administered 2018-02-27: 30 mg via INTRAVENOUS

## 2018-02-27 MED ORDER — ONDANSETRON HCL 4 MG/2ML IJ SOLN
INTRAMUSCULAR | Status: DC | PRN
  Administered 2018-02-27: 4 mg via INTRAVENOUS

## 2018-02-27 MED ORDER — ROCURONIUM BROMIDE 10 MG/ML (PF) SYRINGE
PREFILLED_SYRINGE | INTRAVENOUS | Status: AC
  Filled 2018-02-27: qty 10

## 2018-02-27 MED ORDER — ONDANSETRON HCL 4 MG/2ML IJ SOLN
INTRAMUSCULAR | Status: AC
  Filled 2018-02-27: qty 2

## 2018-02-27 MED ORDER — ONDANSETRON HCL 4 MG/2ML IJ SOLN: 4.0000 mg | Freq: Once | INTRAMUSCULAR | Status: DC | PRN

## 2018-02-27 MED ORDER — KETAMINE HCL 10 MG/ML IJ SOLN
INTRAMUSCULAR | Status: DC | PRN
  Administered 2018-02-27: 40 mg via INTRAVENOUS

## 2018-02-27 MED ORDER — ACETAMINOPHEN 160 MG/5ML PO SOLN: 325.0000 mg | ORAL | Status: DC | PRN

## 2018-02-27 MED ORDER — VANCOMYCIN HCL 1000 MG IV SOLR
INTRAVENOUS | Status: AC
  Filled 2018-02-27: qty 1000

## 2018-02-27 MED ORDER — KETOROLAC TROMETHAMINE 30 MG/ML IJ SOLN
30.0000 mg | Freq: Four times a day (QID) | INTRAMUSCULAR | Status: DC
  Administered 2018-02-27 – 2018-02-28 (×4): 30 mg via INTRAVENOUS
  Filled 2018-02-27 (×4): qty 1

## 2018-02-27 MED ORDER — FENTANYL CITRATE (PF) 250 MCG/5ML IJ SOLN
INTRAMUSCULAR | Status: DC | PRN
  Administered 2018-02-27 (×2): 25 ug via INTRAVENOUS
  Administered 2018-02-27 (×3): 50 ug via INTRAVENOUS

## 2018-02-27 MED ORDER — OXYCODONE HCL 5 MG PO TABS
5.0000 mg | ORAL_TABLET | ORAL | Status: DC | PRN
Start: 2018-02-27 — End: 2018-02-28
  Administered 2018-02-27 – 2018-02-28 (×3): 10 mg via ORAL
  Filled 2018-02-27 (×3): qty 2

## 2018-02-27 MED ORDER — LIDOCAINE 2% (20 MG/ML) 5 ML SYRINGE
INTRAMUSCULAR | Status: DC | PRN
  Administered 2018-02-27: 70 mg via INTRAVENOUS

## 2018-02-27 MED ORDER — MIDAZOLAM HCL 2 MG/2ML IJ SOLN
INTRAMUSCULAR | Status: AC
  Filled 2018-02-27: qty 2

## 2018-02-27 MED ORDER — MEPERIDINE HCL 50 MG/ML IJ SOLN: 6.2500 mg | INTRAMUSCULAR | Status: DC | PRN

## 2018-02-27 SURGICAL SUPPLY — 52 items
BANDAGE ACE 4X5 VEL STRL LF (GAUZE/BANDAGES/DRESSINGS) ×3 IMPLANT
BANDAGE ACE 6X5 VEL STRL LF (GAUZE/BANDAGES/DRESSINGS) ×3 IMPLANT
BIT DRILL 4.2 (DRILL) ×2 IMPLANT
BIT DRILL FLUTED FEMUR 4.2/3 (BIT) ×3 IMPLANT
BLADE SURG 10 STRL SS (BLADE) ×3 IMPLANT
BNDG COHESIVE 4X5 TAN STRL (GAUZE/BANDAGES/DRESSINGS) ×3 IMPLANT
BNDG GAUZE ELAST 4 BULKY (GAUZE/BANDAGES/DRESSINGS) ×3 IMPLANT
BRUSH SCRUB SURG 4.25 DISP (MISCELLANEOUS) ×6 IMPLANT
CHLORAPREP W/TINT 26ML (MISCELLANEOUS) ×3 IMPLANT
COVER SURGICAL LIGHT HANDLE (MISCELLANEOUS) ×3 IMPLANT
DERMABOND ADVANCED (GAUZE/BANDAGES/DRESSINGS) ×2
DERMABOND ADVANCED .7 DNX12 (GAUZE/BANDAGES/DRESSINGS) ×1 IMPLANT
DRAPE C-ARM 42X72 X-RAY (DRAPES) ×3 IMPLANT
DRAPE C-ARMOR (DRAPES) ×3 IMPLANT
DRAPE HALF SHEET 40X57 (DRAPES) ×3 IMPLANT
DRAPE IMP U-DRAPE 54X76 (DRAPES) ×3 IMPLANT
DRAPE INCISE IOBAN 66X45 STRL (DRAPES) ×3 IMPLANT
DRAPE ORTHO SPLIT 77X108 STRL (DRAPES) ×2
DRAPE SURG ORHT 6 SPLT 77X108 (DRAPES) ×1 IMPLANT
DRAPE U-SHAPE 47X51 STRL (DRAPES) ×3 IMPLANT
DRILL 4.2 (DRILL) ×6
DRSG ADAPTIC 3X8 NADH LF (GAUZE/BANDAGES/DRESSINGS) ×3 IMPLANT
ELECT REM PT RETURN 9FT ADLT (ELECTROSURGICAL) ×3
ELECTRODE REM PT RTRN 9FT ADLT (ELECTROSURGICAL) ×1 IMPLANT
GAUZE SPONGE 4X4 12PLY STRL (GAUZE/BANDAGES/DRESSINGS) ×3 IMPLANT
GAUZE SPONGE 4X4 12PLY STRL LF (GAUZE/BANDAGES/DRESSINGS) ×3 IMPLANT
GLOVE BIO SURGEON STRL SZ7.5 (GLOVE) ×6 IMPLANT
GLOVE BIOGEL PI IND STRL 7.5 (GLOVE) ×1 IMPLANT
GLOVE BIOGEL PI INDICATOR 7.5 (GLOVE) ×2
GOWN STRL REUS W/ TWL LRG LVL3 (GOWN DISPOSABLE) ×3 IMPLANT
GOWN STRL REUS W/TWL LRG LVL3 (GOWN DISPOSABLE) ×6
GUIDEWIRE 3.2X400 (WIRE) ×3 IMPLANT
KIT BASIN OR (CUSTOM PROCEDURE TRAY) ×3 IMPLANT
KIT ROOM TURNOVER OR (KITS) ×3 IMPLANT
NAIL TIBIAL CANN 10MM PROX 330 (Nail) ×3 IMPLANT
PACK TOTAL JOINT (CUSTOM PROCEDURE TRAY) ×3 IMPLANT
PAD ARMBOARD 7.5X6 YLW CONV (MISCELLANEOUS) ×3 IMPLANT
PADDING CAST COTTON 6X4 STRL (CAST SUPPLIES) ×3 IMPLANT
REAMER ROD DEEP FLUTE 2.5X950 (INSTRUMENTS) ×3 IMPLANT
SCREW LOCK STAR 5X32 (Screw) ×3 IMPLANT
SCREW LOCK STAR 5X36 (Screw) ×6 IMPLANT
SCREW LOCK STAR 5X38 (Screw) ×3 IMPLANT
SCREW LOCK STAR 5X66 (Screw) ×3 IMPLANT
STAPLER VISISTAT 35W (STAPLE) ×3 IMPLANT
SUT MNCRL AB 3-0 PS2 18 (SUTURE) IMPLANT
SUT VIC AB 0 CT1 27 (SUTURE)
SUT VIC AB 0 CT1 27XBRD ANBCTR (SUTURE) IMPLANT
SUT VIC AB 2-0 CT1 27 (SUTURE)
SUT VIC AB 2-0 CT1 TAPERPNT 27 (SUTURE) IMPLANT
TOWEL OR 17X24 6PK STRL BLUE (TOWEL DISPOSABLE) IMPLANT
TOWEL OR 17X26 10 PK STRL BLUE (TOWEL DISPOSABLE) ×3 IMPLANT
YANKAUER SUCT BULB TIP NO VENT (SUCTIONS) ×3 IMPLANT

## 2018-02-27 NOTE — Anesthesia Procedure Notes (Signed)
Procedure Name: Intubation Date/Time: 02/27/2018 7:56 AM Performed by: Wilburn Cornelia, CRNA Pre-anesthesia Checklist: Patient identified, Emergency Drugs available, Suction available, Patient being monitored and Timeout performed Patient Re-evaluated:Patient Re-evaluated prior to induction Oxygen Delivery Method: Circle system utilized Preoxygenation: Pre-oxygenation with 100% oxygen Induction Type: IV induction Ventilation: Mask ventilation without difficulty Laryngoscope Size: Mac and 3 Grade View: Grade I Tube size: 7.0 mm Number of attempts: 1 Airway Equipment and Method: Stylet Placement Confirmation: ETT inserted through vocal cords under direct vision,  positive ETCO2,  CO2 detector and breath sounds checked- equal and bilateral Secured at: 21 cm Tube secured with: Tape Dental Injury: Teeth and Oropharynx as per pre-operative assessment

## 2018-02-27 NOTE — Progress Notes (Signed)
PT Cancellation Note  Patient Details Name: Ronn MelenaVictoria A Petrucelli MRN: [id number] DOB: [birth year]   Cancelled Treatment:    Reason Eval/Treat Not Completed: Fatigue/lethargy limiting ability to participate. Order received; chart reviewed. Patient politely declining therapy due to fatigue; patient could not hold her eyes open during conversation. Will follow up as schedule allows.   Laurina Bustlearoline Elchonon Maxson, PT, DPT Acute Rehabilitation Services  Pager: [phone]    Vanetta MuldersCarloine H Aleksandr Pellow 02/27/2018, 1:58 PM

## 2018-02-27 NOTE — H&P (Signed)
[facility] ([facility]) H&P  Patient ID: JAIONNA WEISSE MRN: [id number] DOB/AGE: 1981/03/21 36 y.o.  Reason for Surgery:Right tibia fracture Referring Physician: None  HPI: [place] A Dame is an 37 y.o. female who is being seen after a fall [place] a skating rink.  She is not scaling but twisted and had immediate pain and deformity of her lower extremity.  She presented to the [facility] went to urgent care at which point x-rays showed a spiral oblique distal tibia fracture.  She was placed in a long-leg splint and was admitted for pain control and surgery.  She is otherwise healthy.  She is a stay-at-home mom.  She has 3 children 64 27 and 43 years old.  She denies any major medical problems.  She ambulates without assistive device.  Past Medical History:  Diagnosis Date  . Complication of anesthesia   . 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 No pertinent surgical history.  Family History  Problem Relation Age of Onset  . Turner syndrome Other     Social History:  reports that she has never smoked. She has never used smokeless tobacco. She reports that she does not drink alcohol or use drugs.  Allergies:  Allergies  Allergen Reactions  . Morphine And Related Other (See Comments)    Severe burning feeling all over inside    Medications:  No current facility-administered medications on file prior to encounter.    Current Outpatient Medications on File Prior to Encounter  Medication Sig Dispense Refill  . ferrous sulfate 325 (65 FE) MG tablet Take 325 mg by mouth daily with breakfast.      ROS: Constitutional: No fever or chills Vision: No changes in vision ENT: No difficulty swallowing CV: No chest pain Pulm: No SOB or wheezing GI: No nausea or vomiting GU: No urgency or inability to hold urine Skin: No poor wound healing Neurologic: No numbness or tingling Psychiatric: No depression or anxiety Heme: No bruising Allergic: No  reaction to medications or food   Exam: Blood pressure 115/60, pulse 69, temperature 98.2 F (36.8 C), temperature source Oral, resp. rate 16, height  (1.702 m), weight 81.6 kg (180 lb), last menstrual period 02/11/2018, SpO2 100 %, unknown if currently breastfeeding. General: No acute distress Orientation: Awake alert and oriented x3 Mood and Affect: Cooperative and pleasant Gait: Not able to be assessed due to her fracture Coordination and balance: Within normal limits  Right lower extremity: Splint is clean dry and intact.  Compartments are difficult to feel but feels soft around the splint.  Patient able to actively dorsiflex and plantarflex her toes without any pain.  No pain with passive stretch.  Sensation grossly intact to the dorsum and plantar aspect of her toes.  Warm well-perfused toes.  No lymphadenopathy.  Reflexes are not able to be assessed.  No notable instability about the thigh or hip.  Left lower extremity: Skin without lesions. No tenderness to palpation. Full painless ROM, full strength in each muscle groups without evidence of instability.   Medical Decision Making: Imaging: X-rays of the right tibia show a spiral oblique distal tibia fracture without intra-articular extension  Labs:  CBC    Component Value Date/Time   WBC 9.1 02/27/2018 0547   RBC 4.42 02/27/2018 0547   HGB 13.0 02/27/2018 0547   HCT 39.7 02/27/2018 0547   PLT 196 02/27/2018 0547   MCV 89.8 02/27/2018 0547   MCH 29.4 02/27/2018 0547  MCHC 32.7 02/27/2018 0547   RDW 14.1 02/27/2018 0547   LYMPHSABS 2.7 02/27/2018 0547   MONOABS 0.5 02/27/2018 0547   EOSABS 0.0 02/27/2018 0547   BASOSABS 0.0 02/27/2018 0547   Medical history and chart was reviewed  Assessment/Plan: 37 year old female with a right spiral oblique distal tibial shaft fracture  Due to young age and activity level I feel that operative intervention is warranted.  I discussed risks and benefits with the patient.  We  will plan to proceed with intramedullary nailing of the right tibia. Risks discussed included bleeding requiring blood transfusion, bleeding causing a hematoma, infection, malunion, nonunion, damage to surrounding nerves and blood vessels, pain, hardware prominence or irritation, hardware failure, stiffness, post-traumatic arthritis, DVT/PE, compartment syndrome, and even death.  We will plan for admission for pain control and mobilization with therapy.  Likely discharge home postoperative day 1.  Roby Lofts, MD [facility] [phone] (phone)

## 2018-02-27 NOTE — Op Note (Signed)
OrthopaedicSurgeryOperativeNote (ZOX:[id number]) Date of Surgery: 02/27/2018  Admit Date: 02/26/2018   Diagnoses: Pre-Op Diagnoses: Pre-Op Diagnosis Codes:    * Tibia/fibula fracture, shaft, right, closed, initial encounter [S82.201A, S82.401A]   Post-Op Diagnosis: Post-Op Diagnosis Codes:    * Tibia/fibula fracture, shaft, right, closed, initial encounter [S82.201A, S82.401A]  Procedures: 1. CPT 27759-Intramedullary nailing of left tibia 2. CPT 27788-Closed treatment of right fibula fracture  Surgeons: Primary: Roby Lofts, MD   Location:[facility] OR ROOM 03   AnesthesiaGeneral   Antibiotics:Ancef 2g preop   Tourniquettime:None used  EstimatedBloodLoss:50 mL   Complications:None  Specimens:None  Implants: Implant Name Type Inv. Item Serial No. Manufacturer Lot No. LRB No. Used Action  NAIL TIBIAL CANN PROX 330 - [id number] Nail NAIL TIBIAL CANN PROX 330  [employer] [id number] Right 1 Implanted  SCREW LOCK STAR 5X36 - [id number] Screw SCREW LOCK STAR 5X36  [employer]  Right 2 Implanted  SCREW LOCK STAR 5X32 - [id number] Screw SCREW LOCK STAR 5X32  [employer]  Right 1 Implanted  SCREW LOCK STAR 5X38 - [id number] Screw SCREW LOCK STAR 5X38  [employer]  Right 1 Implanted  SCREW LOCK STAR 5X66 - [id number] Screw SCREW LOCK STAR 5X66  [employer]  Right 1 Implanted    IndicationsforSurgery: 37 year old female who sustained of twisting mechanism to right ankle/leg. Had immediate pain and deformity. She was seen in the [facility] she was splinted and admitted for surgery. Risks and benefits discussed. Risks discussed included bleeding requiring blood transfusion, bleeding causing a hematoma, infection, malunion, nonunion, damage to surrounding nerves and blood vessels, pain, hardware prominence or irritation, hardware failure, stiffness, post-traumatic arthritis, DVT/PE, compartment syndrome, and even death. Patient agreed to proceed  with surgery and consent was obtained.  Operative Findings: 1. Closed reduction and intramedullary nailing of right tibia fracture, using Synthes 11x370mm tibia nail 2. Stress exam of ankle without any subluxation or medial clear space widening.  Procedure: The patient was identified in the preoperative holding area. Consent was confirmed with the patient and their family and all questions were answered. The operative extremity was marked after confirmation with the patient. They were then brought back to the operating room by our anesthesia colleagues.  The patient was placed under general anesthetic and then carefully transferred over to a radiolucent flat top table.  Here a bump was placed under the operative hip and a bone foam was placed under the operative extremity.  The operative extremity was then prepped and draped in usual sterile fashion. A preoperative timeout was performed to verify the patient, the procedure, and the extremity. Preoperative antibiotics were dosed.  Fluoroscopic imaging was obtained to show the displacement of the fracture. I made a decision to clamp the fracture to make the nailing easier and allow for anatomic alignment. Percutaneous incisions were made lateral and medial and the tines of the clamp were placed and compression and reduction obtained of the fracture. I confirmed reduction with AP and lateral fluoro.   I then made a lateral parapatellar incision carried down through skin and subcutaneous tissue just lateral to the patellar tendon.  I released a portion of the lateral retinaculum but stayed extra-articular outside the capsule.  I excised the anterior fat pad and then proceeded to place a guidepin under fluoroscopic imaging to confirm adequate placement in both the AP and lateral views.  I then advanced a wire into the proximal metaphysis of the tibia.  I then used an entry reamer to  enter the canal.  I passed a bent ball-tipped guidewire down the center of  the canal and was able to pass in the distal segment .  I seated it down into the physeal scar.  I then measured the length of the nail and I chose a 330 mm nail.  I then proceeded to sequentially ream up from 8.5 mm to 11 mm.  I obtained excellent chatter and I chose to place an 10 mm nail.  I then placed nail across the fracture into the distal segment.  The fracture had excellent reduction after the nail was placed.  I seated the nail to where it was slightly buried at the lateral of the knee.  I then placed 2 distal interlocking screws from medial to lateral using perfect circle technique. I also placed one oblique anteromedial to posterolateral screw.  I then used my proximal jig to place 2 proximal interlocking screws through percutaneous incisions.  I removed the jig and obtained final fluoroscopic imaging.  An external rotation stress view of the ankle was performed which showed no syndesmosis widening and no increase in the medial clear space. The incisions were copiously irrigated.  1 gram of Vancomycin powder was placed in the wound. I closed the lateral parapatellar incision with 0 Vicryl, 2-0 Vicryl and 3-0 nylon.  The remainder the incisions were closed with 3-0 nylon. A sterile dressing was placed. The patient was then awoken from anesthesia and taken to the PACU in stable condition.  Post Op Plan/Instructions: Patient will be nonweightbearing to right leg. She was placed in a boot. Aspirin for DVT prophylaxis. Discharge likely POD1.  I was present and performed the entire surgery.  Truitt MerleKevin Haddix, MD [facility]

## 2018-02-27 NOTE — Progress Notes (Signed)
Orthopedic Tech Progress Note Patient Details:  Sarah Powell [birth year] [id number]  Patient ID: Sarah Powell, female   DOB: [birth year], 37 y.o.   MRN: [id number] Delivered cam walker to or at drs request.  Trinna PostMartinez, Anjel Pardo J 02/27/2018, 9:49 AM

## 2018-02-27 NOTE — Transfer of Care (Signed)
Immediate Anesthesia Transfer of Care Note  Patient: Sarah Powell  Procedure(s) Performed: INTRAMEDULLARY (IM) NAIL TIBIAL (Right )  Patient Location: PACU  Anesthesia Type:General  Level of Consciousness: awake and alert   Airway & Oxygen Therapy: Patient Spontanous Breathing and Patient connected to face mask oxygen  Post-op Assessment: Report given to RN and Post -op Vital signs reviewed and stable  Post vital signs: Reviewed and stable  Last Vitals:  Vitals Value Taken Time  BP 118/87 02/27/2018  9:54 AM  Temp    Pulse 70 02/27/2018  9:55 AM  Resp 11 02/27/2018  9:55 AM  SpO2 100 % 02/27/2018  9:55 AM  Vitals shown include unvalidated device data.  Last Pain:  Vitals:   02/27/18 0521  TempSrc: Oral  PainSc:       Patients Stated Pain Goal: 4 (02/27/18 [id number])  Complications: No apparent anesthesia complications

## 2018-02-27 NOTE — Progress Notes (Signed)
Patient refused to remove nose ring this morning.Spoke to OR tech stated it will be taken care of downstairs.

## 2018-02-27 NOTE — Progress Notes (Signed)
Patient has nose ring in right nostril and states that it can not be removed.  Dr. Tacy Duraddono notified of nose ring

## 2018-02-27 NOTE — Anesthesia Postprocedure Evaluation (Signed)
Anesthesia Post Note  Patient: Sarah Powell  Procedure(s) Performed: INTRAMEDULLARY (IM) NAIL TIBIAL (Right )     Patient location during evaluation: PACU Anesthesia Type: General Level of consciousness: awake and alert Pain management: pain level controlled Vital Signs Assessment: post-procedure vital signs reviewed and stable Respiratory status: spontaneous breathing, nonlabored ventilation, respiratory function stable and patient connected to nasal cannula oxygen Cardiovascular status: blood pressure returned to baseline and stable Postop Assessment: no apparent nausea or vomiting Anesthetic complications: no    Last Vitals:  Vitals Value Taken Time  BP    Temp    Pulse    Resp    SpO2      Last Pain:  Vitals:   02/27/18 1131  TempSrc: Oral  PainSc: 8                  Elner Seifert

## 2018-02-27 NOTE — Progress Notes (Signed)
Patient has not signed consent form for surgery yet.Patient stated," The doctor has not been in to go over the surgery with me yet."Consent form on chart to be signed when MD answers patient questions and goes over the procedure with her.

## 2018-02-28 LAB — COMPREHENSIVE METABOLIC PANEL
ALBUMIN: 3.1 g/dL — AB (ref 3.5–5.0)
ALT: 8 U/L — ABNORMAL LOW (ref 14–54)
ANION GAP: 10 (ref 5–15)
AST: 14 U/L — AB (ref 15–41)
Alkaline Phosphatase: 32 U/L — ABNORMAL LOW (ref 38–126)
BILIRUBIN TOTAL: 0.4 mg/dL (ref 0.3–1.2)
BUN: 9 mg/dL (ref 6–20)
CHLORIDE: 104 mmol/L (ref 101–111)
CO2: 25 mmol/L (ref 22–32)
Calcium: 8.5 mg/dL — ABNORMAL LOW (ref 8.9–10.3)
Creatinine, Ser: 0.81 mg/dL (ref 0.44–1.00)
GFR calc Af Amer: >60 mL/min (ref >60)
GFR calc non Af Amer: >60 mL/min (ref >60)
GLUCOSE: 112 mg/dL — AB (ref 65–99)
POTASSIUM: 3.6 mmol/L (ref 3.5–5.1)
SODIUM: 139 mmol/L (ref 135–145)
Total Protein: 5.3 g/dL — ABNORMAL LOW (ref 6.5–8.1)

## 2018-02-28 LAB — CBC
HEMATOCRIT: 31.9 % — AB (ref 36.0–46.0)
Hemoglobin: 10.1 g/dL — ABNORMAL LOW (ref 12.0–15.0)
MCH: 28.8 pg (ref 26.0–34.0)
MCHC: 31.7 g/dL (ref 30.0–36.0)
MCV: 90.9 fL (ref 78.0–100.0)
PLATELETS: 166 10*3/uL (ref 150–400)
RBC: 3.51 MIL/uL — ABNORMAL LOW (ref 3.87–5.11)
RDW: 14.3 % (ref 11.5–15.5)
WBC: 8.3 10*3/uL (ref 4.0–10.5)

## 2018-02-28 MED ORDER — DOCUSATE SODIUM 100 MG PO CAPS: 100.0000 mg | ORAL_CAPSULE | Freq: Every day | ORAL | Status: DC

## 2018-02-28 MED ORDER — POLYETHYLENE GLYCOL 3350 17 G PO PACK: 17.0000 g | PACK | Freq: Every day | ORAL | Status: DC | PRN

## 2018-02-28 NOTE — Discharge Summary (Signed)
PATIENT ID: Sarah Powell        MRN:  [id number]          DOB/AGE: 36-Dec-1982 / 36 y.o.    DISCHARGE SUMMARY  ADMISSION DATE:    02/26/2018 DISCHARGE DATE:   02/28/2018   ADMISSION DIAGNOSIS: Tibia Fracture    DISCHARGE DIAGNOSIS:  Right tibia fracture    ADDITIONAL DIAGNOSIS: Principal Problem:   Tibia/fibula fracture, shaft, right, closed, initial encounter Active Problems:   Fracture of tibial shaft, right, closed  Past Medical History:  Diagnosis Date  . Complication of anesthesia   . 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 admitted on 02/26/2018 and found to have a diagnosis of Right tibia fracture.  After appropriate laboratory studies were obtained  they were taken to the operating room on 02/27/2018 and underwent  Procedure(s): INTRAMEDULLARY (IM) NAIL TIBIAL  Right.   They were given perioperative antibiotics:  Anti-infectives (From admission, onward)   Start     Dose/Rate Route Frequency Ordered Stop   02/27/18 1600  ceFAZolin (ANCEF) IVPB 2g/100 mL premix     2 g 200 mL/hr over 30 Minutes Intravenous Every 8 hours 02/27/18 1122 02/28/18 1058   02/27/18 0912  vancomycin (VANCOCIN) powder  Status:  Discontinued       As needed 02/27/18 0913 02/27/18 0950   02/27/18 0600  ceFAZolin (ANCEF) IVPB 2g/100 mL premix     2 g 200 mL/hr over 30 Minutes Intravenous On call to O.R. 02/26/18 2232 02/27/18 4098    .  Tolerated the procedure well.    POD #1, allowed out of bed to a chair.  PT for ambulation and exercise program. O2 discontionued.  The remainder of the hospital course was dedicated to ambulation and strengthening and pain control.   The patient was discharged on 1 Day Post-Op in  Stable condition.  Blood products given:none  DIAGNOSTIC STUDIES: Recent vital  signs:  Patient Vitals for the past 24 hrs:  BP Temp Temp src Pulse Resp SpO2  02/28/18 0816 (!) 105/57 98.1 F (36.7 C) Oral 62 16 100 %  02/28/18 0453 (!) 94/48 98 F (36.7 C) Oral 63 18 98 %  02/28/18 0002 (!) 115/44 98.4 F (36.9 C) Oral 85 16 100 %  02/27/18 1947 (!) 107/51 98.5 F (36.9 C) Oral 72 16 100 %  02/27/18 1300 110/65 98.5 F (36.9 C) Oral 71 16 94 %  02/27/18 1131 110/60 98.1 F (36.7 C) Oral 73 16 96 %       Recent laboratory studies: Recent Labs    02/26/18 2210 02/27/18 0547 02/28/18 0732  WBC 16.6* 9.1 8.3  HGB 14.3 13.0 10.1*  HCT 43.1 39.7 31.9*  PLT 209 196 166   Recent Labs    02/26/18 2210 02/27/18 0547 02/28/18 0732  NA 138 139 139  K 3.4* 3.7 3.6  CL 103 105 104  CO2 23 25 25   BUN 15 10 9   CREATININE 0.81 0.75 0.81  GLUCOSE 101* 106* 112*  CALCIUM 9.9 9.4 8.5*   Lab Results  Component Value Date   INR 0.97 02/27/2018     Recent Radiographic Studies :  Dg Tibia/fibula Right  Result Date: 02/27/2018 CLINICAL DATA:  Tibial fracture.  ORIF. EXAM: DG C-ARM 61-120  MIN; RIGHT TIBIA AND FIBULA - 2 VIEW COMPARISON:  None. FINDINGS: Multiple C-arm images show placement of a tibial intramedullary nail with proximal and distal locking screws. Restoration of anatomic alignment. Oblique fracture of the distal fibula also in anatomic alignment. IMPRESSION: Good appearance following tibial intramedullary nail. Electronically Signed   By: Paulina FusiMark  Shogry M.D.   On: 02/27/2018 10:02   Dg Chest Port 1 View  Result Date: 02/27/2018 CLINICAL DATA:  Preop evaluation for upcoming right leg surgery EXAM: PORTABLE CHEST 1 VIEW COMPARISON:  None. FINDINGS: The heart size and mediastinal contours are within normal limits. Both lungs are clear. The visualized skeletal structures are unremarkable. IMPRESSION: No active disease. Electronically Signed   By: Alcide CleverMark  Lukens M.D.   On: 02/27/2018 07:07   Dg Tibia/fibula Right Port  Result Date: 02/27/2018 CLINICAL DATA:   ORIF. EXAM: PORTABLE RIGHT TIBIA AND FIBULA - 2 VIEW COMPARISON:  Right lower extremity. FINDINGS: Scratch ORIF right tibia fracture. Hardware intact. Anatomic alignment. Soft tissue swelling no radiopaque foreign body. IMPRESSION: ORIF right tibia.  Anatomic alignment. Electronically Signed   By: Maisie Fushomas  Register   On: 02/27/2018 11:02   Dg C-arm 1-60 Min  Result Date: 02/27/2018 CLINICAL DATA:  Tibial fracture.  ORIF. EXAM: DG C-ARM 61-120 MIN; RIGHT TIBIA AND FIBULA - 2 VIEW COMPARISON:  None. FINDINGS: Multiple C-arm images show placement of a tibial intramedullary nail with proximal and distal locking screws. Restoration of anatomic alignment. Oblique fracture of the distal fibula also in anatomic alignment. IMPRESSION: Good appearance following tibial intramedullary nail. Electronically Signed   By: Paulina FusiMark  Shogry M.D.   On: 02/27/2018 10:02    DISCHARGE INSTRUCTIONS:   DISCHARGE MEDICATIONS:   Allergies as of 02/28/2018      Reactions   Morphine And Related Other (See Comments)   Severe burning feeling all over inside      Medication List    TAKE these medications   aspirin EC 325 MG tablet Take 1 tablet (325 mg total) by mouth daily.   ferrous sulfate 325 (65 FE) MG tablet Take 325 mg by mouth daily with breakfast.   methocarbamol 750 MG tablet Commonly known as:  ROBAXIN-750 Take 1 tablet (750 mg total) by mouth 4 (four) times daily.   oxyCODONE-acetaminophen 5-325 MG tablet Commonly known as:  PERCOCET Take 1-2 tablets by mouth every 4 (four) hours as needed for severe pain.       FOLLOW UP VISIT:   Follow-up Information    Haddix, Gillie MannersKevin P, MD. Schedule an appointment as soon as possible for a visit in 2 week(s).   Specialty:  Orthopedic Surgery Contact information: [address] [place] [address] [phone]           DISPOSITION:   Home  CONDITION:  Stable   Margart SicklesJoshua Boen Sterbenz, PA-C  02/28/2018 11:26 AM

## 2018-02-28 NOTE — Progress Notes (Signed)
Pt given prescriptions and discharge instructions, all questions answered to satisfaction. Pt in no distress at time of discharge. All belongings gathered to go home and pt is waiting for husband to pick her up.

## 2018-03-02 ENCOUNTER — Encounter (HOSPITAL_COMMUNITY): Payer: Self-pay | Admitting: Student

## 2018-03-07 ENCOUNTER — Other Ambulatory Visit: Payer: Self-pay | Admitting: Student

## 2022-01-05 ENCOUNTER — Emergency Department (HOSPITAL_COMMUNITY): Payer: 59

## 2022-01-05 ENCOUNTER — Emergency Department (HOSPITAL_COMMUNITY)
Admission: EM | Admit: 2022-01-05 | Discharge: 2022-01-05 | Disposition: A | Discharge status: Home / Self Care | Payer: 59 | Attending: Emergency Medicine | Admitting: Emergency Medicine

## 2022-01-05 ENCOUNTER — Other Ambulatory Visit: Payer: Self-pay

## 2022-01-05 ENCOUNTER — Encounter (HOSPITAL_COMMUNITY): Payer: Self-pay | Admitting: *Deleted

## 2022-01-05 DIAGNOSIS — R0789 Other chest pain: Secondary | ICD-10-CM | POA: Diagnosis not present

## 2022-01-05 DIAGNOSIS — G44209 Tension-type headache, unspecified, not intractable: Secondary | ICD-10-CM | POA: Insufficient documentation

## 2022-01-05 DIAGNOSIS — K824 Cholesterolosis of gallbladder: Secondary | ICD-10-CM | POA: Diagnosis not present

## 2022-01-05 DIAGNOSIS — R0602 Shortness of breath: Secondary | ICD-10-CM | POA: Insufficient documentation

## 2022-01-05 DIAGNOSIS — R1084 Generalized abdominal pain: Secondary | ICD-10-CM

## 2022-01-05 LAB — CBC WITH DIFFERENTIAL/PLATELET
Abs Immature Granulocytes: 0.02 10*3/uL (ref 0.00–0.07)
Basophils Absolute: 0 10*3/uL (ref 0.0–0.1)
Basophils Relative: 1 %
Eosinophils Absolute: 0 10*3/uL (ref 0.0–0.5)
Eosinophils Relative: 1 %
HCT: 40.2 % (ref 36.0–46.0)
Hemoglobin: 13.1 g/dL (ref 12.0–15.0)
Immature Granulocytes: 0 %
Lymphocytes Relative: 32 %
Lymphs Abs: 1.7 10*3/uL (ref 0.7–4.0)
MCH: 27.6 pg (ref 26.0–34.0)
MCHC: 32.6 g/dL (ref 30.0–36.0)
MCV: 84.8 fL (ref 80.0–100.0)
Monocytes Absolute: 0.4 10*3/uL (ref 0.1–1.0)
Monocytes Relative: 7 %
Neutro Abs: 3.3 10*3/uL (ref 1.7–7.7)
Neutrophils Relative %: 59 %
Platelets: 225 10*3/uL (ref 150–400)
RBC: 4.74 MIL/uL (ref 3.87–5.11)
RDW: 13.4 % (ref 11.5–15.5)
WBC: 5.5 10*3/uL (ref 4.0–10.5)
nRBC: 0 % (ref 0.0–0.2)

## 2022-01-05 LAB — COMPREHENSIVE METABOLIC PANEL
ALT: 14 U/L (ref 0–44)
AST: 15 U/L (ref 15–41)
Albumin: 3.9 g/dL (ref 3.5–5.0)
Alkaline Phosphatase: 36 U/L — ABNORMAL LOW (ref 38–126)
Anion gap: 8 (ref 5–15)
BUN: 9 mg/dL (ref 6–20)
CO2: 22 mmol/L (ref 22–32)
Calcium: 9 mg/dL (ref 8.9–10.3)
Chloride: 108 mmol/L (ref 98–111)
Creatinine, Ser: 0.89 mg/dL (ref 0.44–1.00)
GFR, Estimated: >60 mL/min (ref >60)
Glucose, Bld: 98 mg/dL (ref 70–99)
Potassium: 4 mmol/L (ref 3.5–5.1)
Sodium: 138 mmol/L (ref 135–145)
Total Bilirubin: 0.8 mg/dL (ref 0.3–1.2)
Total Protein: 6.6 g/dL (ref 6.5–8.1)

## 2022-01-05 LAB — I-STAT BETA HCG BLOOD, ED (MC, WL, AP ONLY): I-stat hCG, quantitative: <5 m[IU]/mL (ref <5)

## 2022-01-05 LAB — D-DIMER, QUANTITATIVE: D-Dimer, Quant: <0.27 ug/mL-FEU (ref 0.00–0.50)

## 2022-01-05 LAB — LIPASE, BLOOD: Lipase: 30 U/L (ref 11–51)

## 2022-01-05 LAB — TROPONIN I (HIGH SENSITIVITY): Troponin I (High Sensitivity): <2 ng/L (ref <18)

## 2022-01-05 MED ORDER — DICLOFENAC SODIUM 50 MG PO TBEC: 50.0000 mg | DELAYED_RELEASE_TABLET | Freq: Two times a day (BID) | ORAL | 0 refills | Status: AC

## 2022-01-05 MED ORDER — METHOCARBAMOL 500 MG PO TABS: 500.0000 mg | ORAL_TABLET | Freq: Two times a day (BID) | ORAL | 0 refills | Status: AC

## 2022-01-05 MED ORDER — SODIUM CHLORIDE 0.9 % IV BOLUS
1000.0000 mL | Freq: Once | INTRAVENOUS | Status: AC
  Administered 2022-01-05: 1000 mL via INTRAVENOUS

## 2022-01-05 MED ORDER — FENTANYL CITRATE PF 50 MCG/ML IJ SOSY
50.0000 ug | PREFILLED_SYRINGE | Freq: Once | INTRAMUSCULAR | Status: AC
  Administered 2022-01-05: 50 ug via INTRAVENOUS
  Filled 2022-01-05: qty 1

## 2022-01-05 NOTE — ED Provider Notes (Signed)
[facility] EMERGENCY DEPARTMENT Provider Note   CSN: [id number] Arrival date & time: 01/05/22  1023     History  Chief Complaint  Patient presents with   Back Pain   Chest Pain    Sarah Powell is a 41 y.o. female.  41 year old female with no significant past medical history presents with complaint of pain in her chest and back as well as shortness of breath.  States her symptoms started about a week ago and bilateral axillary areas rating around to anterior lower chest associated with shortness of breath.  She thought this was initially caused by lifting an approximate 30 pound bag of dog food.  Pain has progressed to involve her whole back, states it radiates up to her left occipital area and is giving her a tension headache.  As upper abdominal/lower chest pain as well as suprapubic discomfort.  Denies changes in bowel or bladder habits.  Pain is worse with walking, movement, deep breaths.  Denies history of PE or DVT.      Home Medications Prior to Admission medications   Medication Sig Start Date End Date Taking? Authorizing Provider  diclofenac (VOLTAREN) 50 MG EC tablet Take 1 tablet (50 mg total) by mouth 2 (two) times daily for 10 days. 01/05/22 01/15/22 Yes Tacy Learn, PA-C  methocarbamol (ROBAXIN) 500 MG tablet Take 1 tablet (500 mg total) by mouth 2 (two) times daily. 01/05/22  Yes Tacy Learn, PA-C  ferrous sulfate 325 (65 FE) MG tablet Take 325 mg by mouth daily with breakfast.    [provider]      Allergies    Morphine and related    Review of Systems   Review of Systems  Constitutional:  Negative for fatigue.  Respiratory:  Positive for shortness of breath.   Cardiovascular:  Negative for chest pain.  Gastrointestinal:  Positive for abdominal pain. Negative for constipation, diarrhea, nausea and vomiting.  Genitourinary:  Negative for dysuria.  Musculoskeletal:  Negative for arthralgias and myalgias.  Skin:  Negative for  rash and wound.  Allergic/Immunologic: Negative for immunocompromised state.  Neurological:  Positive for headaches. Negative for weakness and numbness.  Psychiatric/Behavioral:  Negative for confusion.   All other systems reviewed and are negative.  Physical Exam Updated Vital Signs BP (!) 113/59    Pulse 62    Temp 98.1 F (36.7 C)    Resp 13    Ht 5\' 7"  (1.702 m)    Wt 79.4 kg    SpO2 100%    BMI 27.41 kg/m  Physical Exam Vitals and nursing note reviewed.  Constitutional:      General: She is not in acute distress.    Appearance: She is well-developed. She is not diaphoretic.     Comments: Tearful  HENT:     Head: Normocephalic and atraumatic.  Eyes:     Conjunctiva/sclera: Conjunctivae normal.  Cardiovascular:     Rate and Rhythm: Normal rate and regular rhythm.     Heart sounds: Normal heart sounds.  Pulmonary:     Effort: Pulmonary effort is normal.     Breath sounds: Normal breath sounds.  Chest:     Chest wall: Tenderness present.    Abdominal:     Palpations: Abdomen is soft.     Tenderness: There is abdominal tenderness in the right upper quadrant, epigastric area, suprapubic area and left upper quadrant.  Musculoskeletal:       Back:  Skin:  General: Skin is warm and dry.     Findings: No erythema or rash.  Neurological:     Mental Status: She is alert and oriented to person, place, and time.     Sensory: No sensory deficit.     Motor: No weakness.     Deep Tendon Reflexes: Reflexes normal.  Psychiatric:        Behavior: Behavior normal.    ED Results / Procedures / Treatments   Labs (all labs ordered are listed, but only abnormal results are displayed) Labs Reviewed  COMPREHENSIVE METABOLIC PANEL - Abnormal; Notable for the following components:      Result Value   Alkaline Phosphatase 36 (*)    All other components within normal limits  CBC WITH DIFFERENTIAL/PLATELET  D-DIMER, QUANTITATIVE  LIPASE, BLOOD  I-STAT BETA HCG BLOOD, ED ([facility], [facility], [facility]  ONLY)  TROPONIN I (HIGH SENSITIVITY)    EKG EKG Interpretation  Date/Time:  Saturday January 05 2022 10:44:14 EST Ventricular Rate:  74 PR Interval:  107 QRS Duration: 79 QT Interval:  390 QTC Calculation: 433 R Axis:   56 Text Interpretation: Sinus rhythm Short PR interval Confirmed by Lennice Sites ([id number]) on 01/05/2022 10:48:41 AM  Radiology US Abdomen Limited  Result Date: 01/05/2022 CLINICAL DATA:  Pain right upper quadrant. EXAM: ULTRASOUND ABDOMEN LIMITED RIGHT UPPER QUADRANT COMPARISON:  None FINDINGS: Gallbladder: There is 8 mm hyperechoic structure in the nondependent portion of gallbladder wall. No definite demonstrable gallbladder stones are seen. There is no significant wall thickening in the gallbladder. There is no fluid around the gallbladder. Common bile duct: Diameter: 6.6 mm Liver: No focal lesion identified. There is slightly increased echogenicity in the liver. Portal vein is patent on color Doppler imaging with normal direction of blood flow towards the liver. Other: None. IMPRESSION: There is 8 mm gallbladder polyp. There are no definite sonographic signs acute cholecystitis. Fatty liver. Electronically Signed   By: Elmer Picker M.D.   On: 01/05/2022 13:28   DG Chest Port 1 View  Result Date: 01/05/2022 CLINICAL DATA:  41 year old female with history of shortness of breath and chest wall pain. EXAM: PORTABLE CHEST 1 VIEW COMPARISON:  Chest x-ray 02/27/2018. FINDINGS: Lung volumes are normal. No consolidative airspace disease. No pleural effusions. No pneumothorax. No pulmonary nodule or mass noted. Pulmonary vasculature and the cardiomediastinal silhouette are within normal limits. IMPRESSION: No radiographic evidence of acute cardiopulmonary disease. Electronically Signed   By: Vinnie Langton M.D.   On: 01/05/2022 11:09    Procedures Procedures    Medications Ordered in ED Medications  sodium chloride 0.9 % bolus 1,000 mL (1,000 mLs Intravenous New  Bag/Given 01/05/22 1131)  sodium chloride 0.9 % bolus 1,000 mL (1,000 mLs Intravenous New Bag/Given 01/05/22 1129)  fentaNYL (SUBLIMAZE) injection 50 mcg (50 mcg Intravenous Given 01/05/22 1126)    ED Course/ Medical Decision Making/ A&P                           Medical Decision Making Amount and/or Complexity of Data Reviewed Labs: ordered. Radiology: ordered.  Risk Prescription drug management.   This patient presents to the ED for concern of chest wall pain, shortness of breath, abdominal pain, back pain, this involves an extensive number of treatment options, and is a complaint that carries with it a high risk of complications and morbidity.  The differential diagnosis includes but not limited to PE, musculoskeletal source, intra-abdominal infection, cholelithiasis, pancreatitis, ectopic pregnancy.  Co morbidities that complicate the patient evaluation  None   Additional history obtained:  Additional history obtained from husband at bedside External records from outside source obtained and reviewed including no recent relevant records on file   Lab Tests:  I Ordered, and personally interpreted labs.  The pertinent results include: See BC with normal white blood cell count, CMP with normal renal and hepatic function, no electrolyte abnormality, lipase within normal limits, hCG negative, troponin less than 2, D-dimer negative.   Imaging Studies ordered:  I ordered imaging studies including chest x-ray and ultrasound gallbladder I independently visualized and interpreted imaging which showed chest x-ray with no acute disease today I agree with the radiologist interpretation   Cardiac Monitoring:  The patient was maintained on a cardiac monitor.  I personally viewed and interpreted the cardiac monitored which showed an underlying rhythm of: sinus rhythm    Medicines ordered and prescription drug management:  I ordered medication including fentanyl for  pain Reevaluation of the patient after these medicines showed that the patient improved although reports feeling groggy.  Declined additional pain medication even nonnarcotic medications. I have reviewed the patients home medicines and have made adjustments as needed   Test Considered:  CT PE study however is low risk per Well's score and has negative d-dimer.   Problem List / ED Course:  41 year old female presents with complaint of pain that started in bilateral axillary areas, radiates under her breasts as well as upper abdomen, suprapubic area and diffuse back.  She notes radiates up to left occipital area resulting in headache at times.  States prior to onset of her pain she lifted a 30 pound bag of dog food and questions if this caused her pain.  On exam she is found to have tenderness to her diffuse back, axillary chest as well as anterior lower chest, upper abdomen and suprapubic areas.  She is tearful on presentation, allergic to morphine and was given fentanyl for her pain.  Her work-up today is largely reassuring.  Doubt PE based on Wells criteria negative for D-dimer.  Her troponin is negative at less than 2, history is not suspicious for ACS.  hCG is negative.  Lipase, CBC, CMP within normal limits.  Discussed results with patient who is frustrated no source for her pain was found today.  Offered to and completed ultrasound of the right upper quadrant with concerns for nausea, respiratory pain and upper abdominal pain with back pain, this is negative for acute cholecystitis or cholelithiasis, does show a small polyp.  Discussed results with patient and husband.  Patient is tearful and frustrated with her work-up in the emergency room today.  She is requesting ultrasound of her axillary areas and just as this is where she is having pain.  Explained that the study is not particularly helpful in evaluation of these areas unless we are concerned for a mass such as an axillary mass.  Patient  request mammogram, discussed limitations in the ER, this would be to be completed through her PCP.  Patient is concerned for cancer, offered reassurance with normal white blood cell count with normal calcium and platelets as well as normal metabolic panel.  Also normal chest x-ray.  Encouraged patient to follow-up with her primary care.  Given return to ER precautions.  Prescriptions for Robaxin and diclofenac sent to patient's pharmacy.   Reevaluation:  After the interventions noted above, I reevaluated the patient and found that they have :stayed the same   Social Determinants  of Health:  No PCP, recommend using referral line or contact the insurance for network provider   Dispostion:  After consideration of the diagnostic results and the patients response to treatment, I feel that the patent would benefit from follow-up with primary care provider.          Final Clinical Impression(s) / ED Diagnoses Final diagnoses:  Atypical chest pain  Generalized abdominal pain  Gallbladder polyp    Rx / DC Orders ED Discharge Orders          Ordered    methocarbamol (ROBAXIN) 500 MG tablet  2 times daily        01/05/22 1355    diclofenac (VOLTAREN) 50 MG EC tablet  2 times daily        01/05/22 1355              Roque Lias 01/05/22 1415    Cur[place]olo, Adam, DO 01/05/22 1500

## 2022-01-05 NOTE — ED Notes (Signed)
Patient transported to Ultrasound 

## 2022-01-05 NOTE — ED Triage Notes (Signed)
Patient presents to ed c/o back pain after lift a 30 bag of dog food. However the pain has returned and radiates around epigastric area and into her upper back

## 2022-01-05 NOTE — Discharge Instructions (Addendum)
Follow up with your doctor for recheck. Take Robaxin and Diclofenac as needed as prescribed for pain. Return to the ER for new or worsening symptoms.

## 2022-01-11 ENCOUNTER — Other Ambulatory Visit: Payer: Self-pay | Admitting: Obstetrics and Gynecology

## 2022-01-11 DIAGNOSIS — N644 Mastodynia: Secondary | ICD-10-CM

## 2022-01-15 ENCOUNTER — Ambulatory Visit: Payer: 59

## 2022-01-15 ENCOUNTER — Ambulatory Visit
Admission: RE | Admit: 2022-01-15 | Discharge: 2022-01-15 | Disposition: A | Discharge status: Home / Self Care | Payer: 59 | Source: Ambulatory Visit | Attending: Obstetrics and Gynecology | Admitting: Obstetrics and Gynecology

## 2022-01-15 DIAGNOSIS — N644 Mastodynia: Secondary | ICD-10-CM

## 2023-09-19 IMAGING — MG DIGITAL DIAGNOSTIC BILAT W/ TOMO W/ CAD
8 series · 8 of 24 positions shown · non-contrast
Comparison: None.

CLINICAL DATA: 40-year-old female presenting with 2 weeks of
diffuse bilateral intermittent breast pain, right breast greater
than left.

EXAM:
DIGITAL DIAGNOSTIC BILATERAL MAMMOGRAM WITH TOMOSYNTHESIS AND CAD
TECHNIQUE: Bilateral digital diagnostic mammography and breast tomosynthesis
was performed. The images were evaluated with computer-aided
detection.

[R MLO synth-2D]
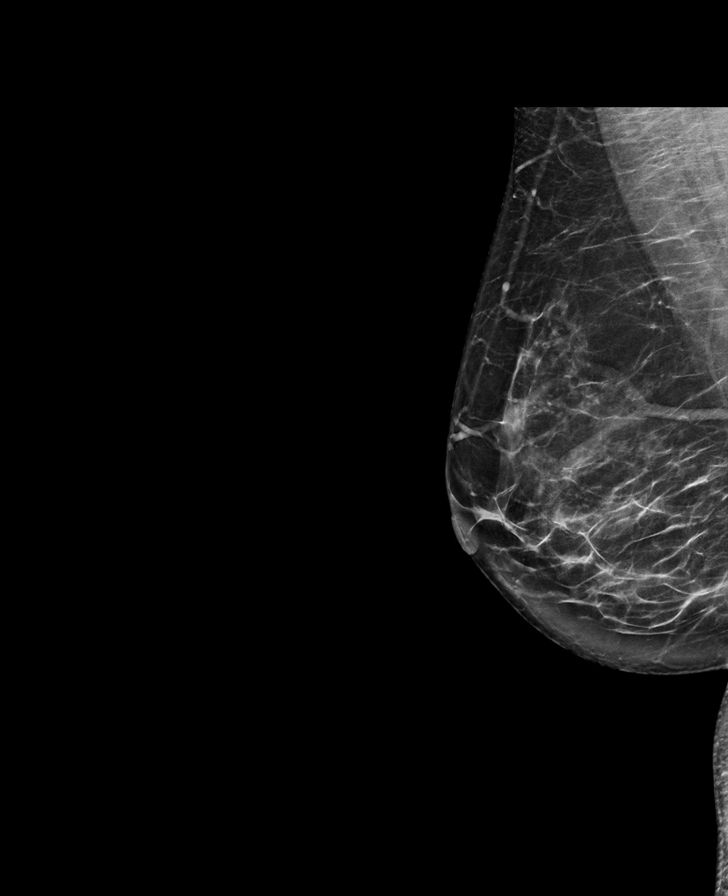

[L MLO synth-2D]
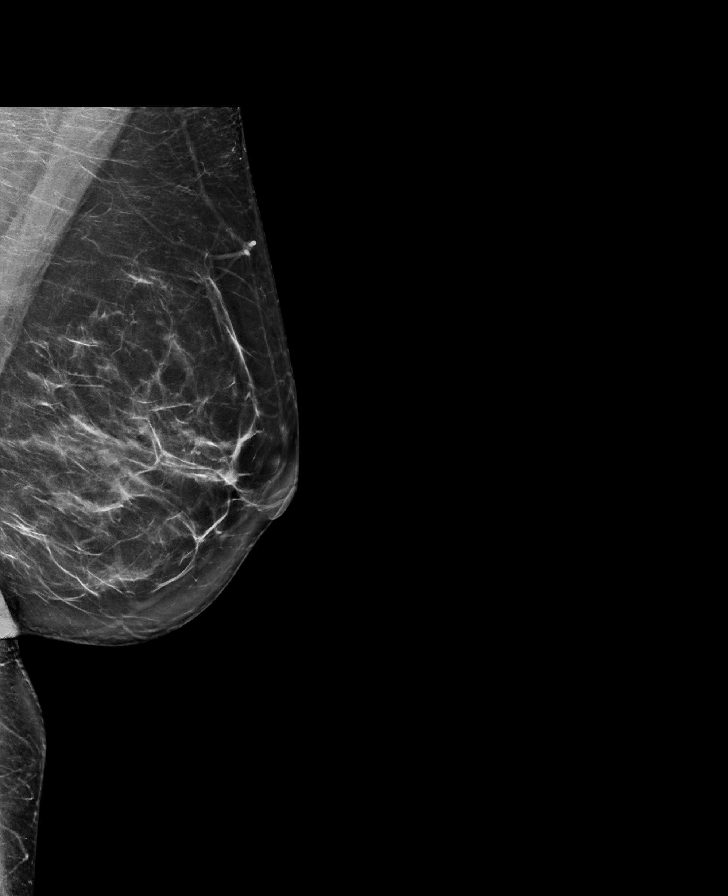

[R CC synth-2D]
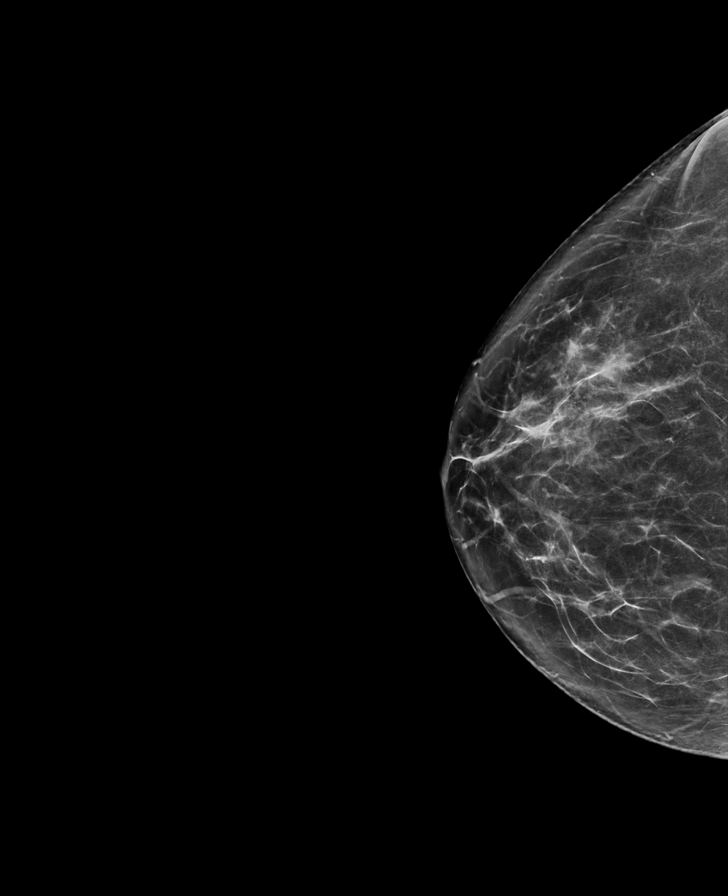

[L CC synth-2D]
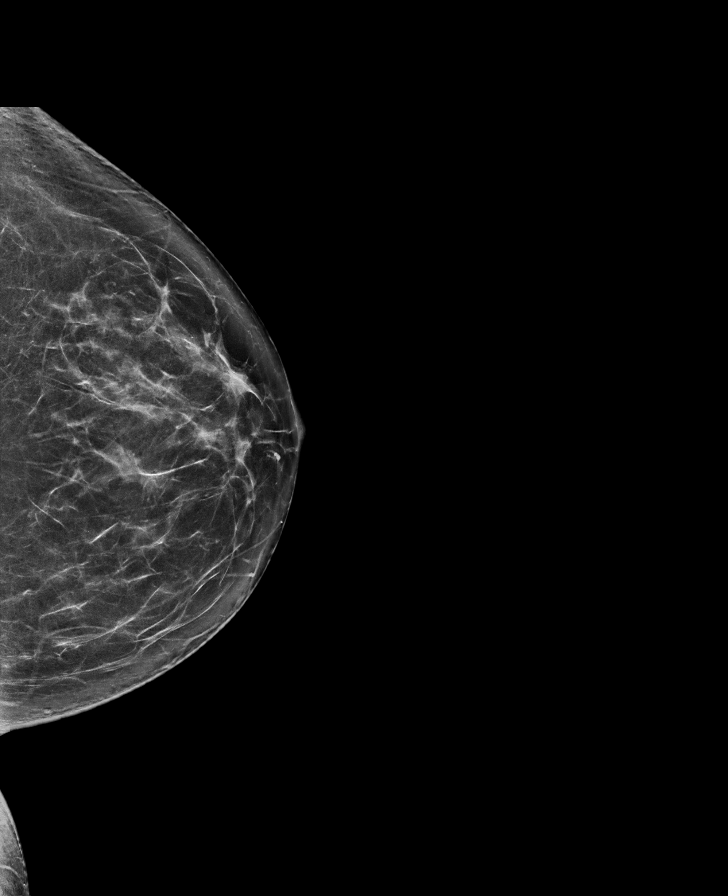

[R CC tomo · tomo slice 37/72.0]
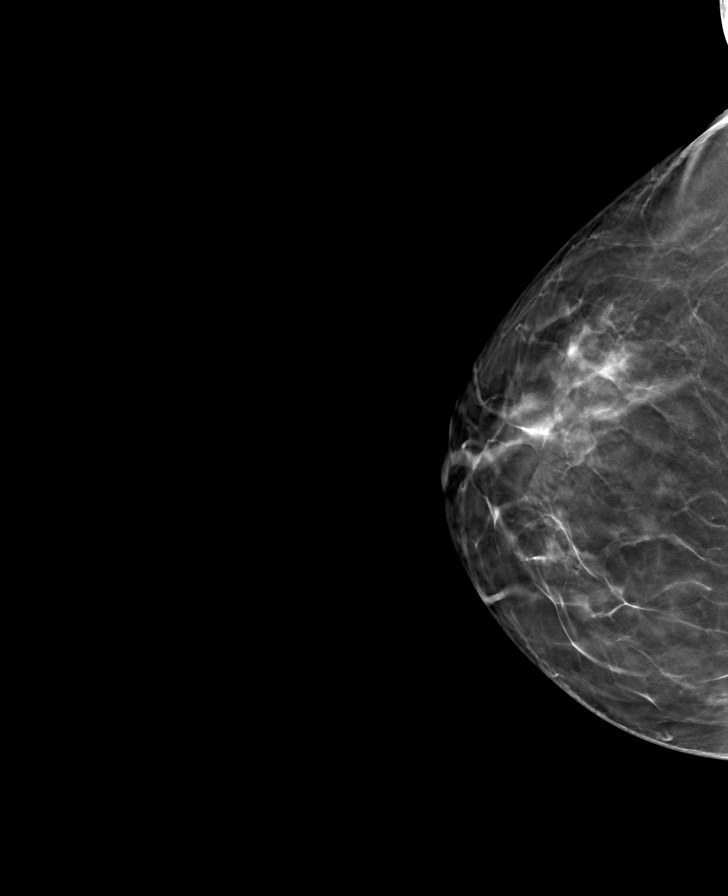

[R MLO tomo · tomo slice 39/78.0]
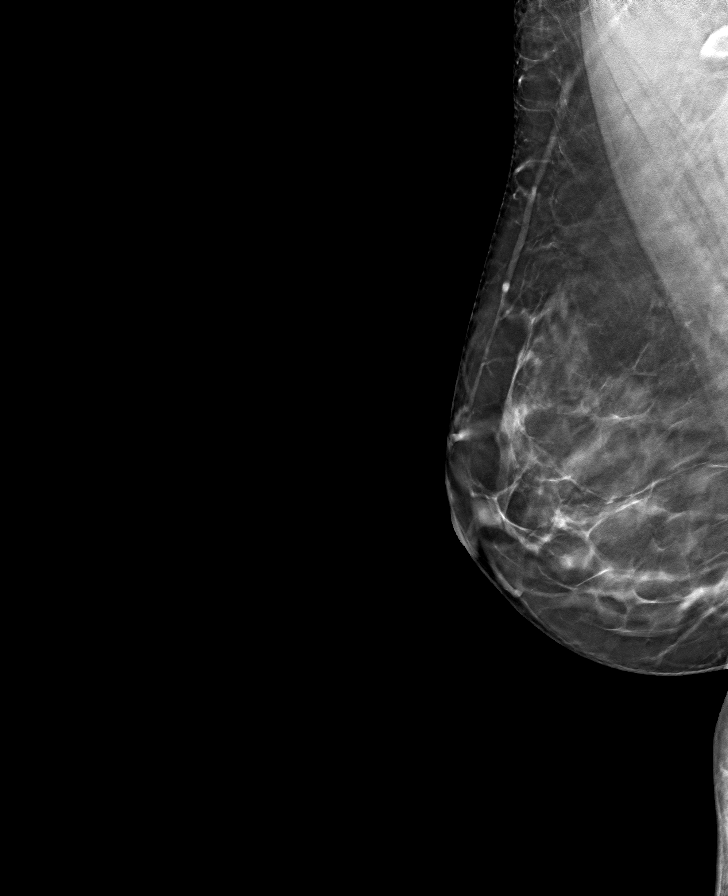

[L CC tomo · tomo slice 37/74.0]
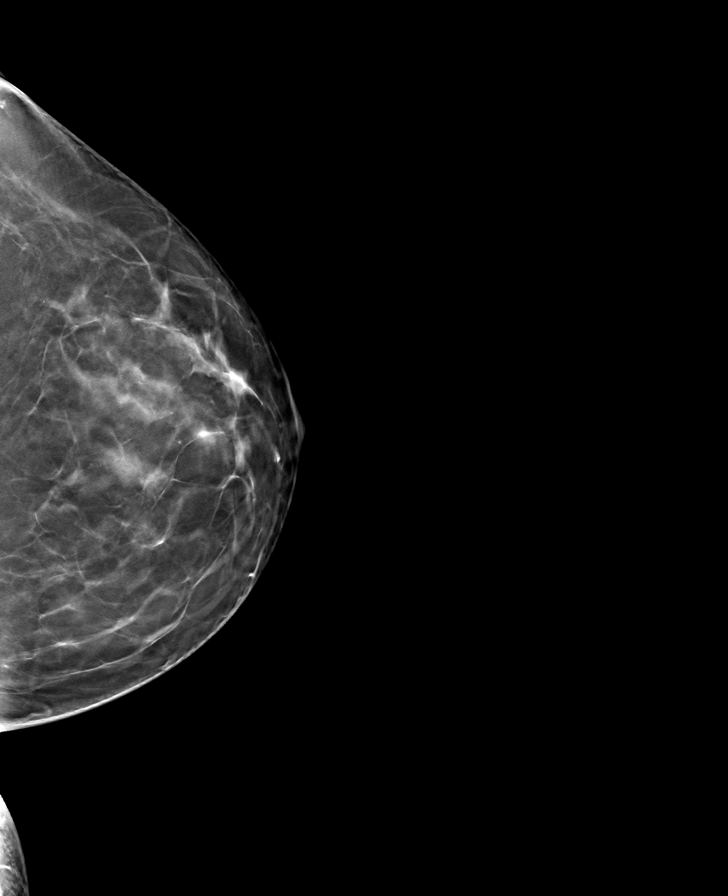

[L MLO tomo · tomo slice 41/80.0]
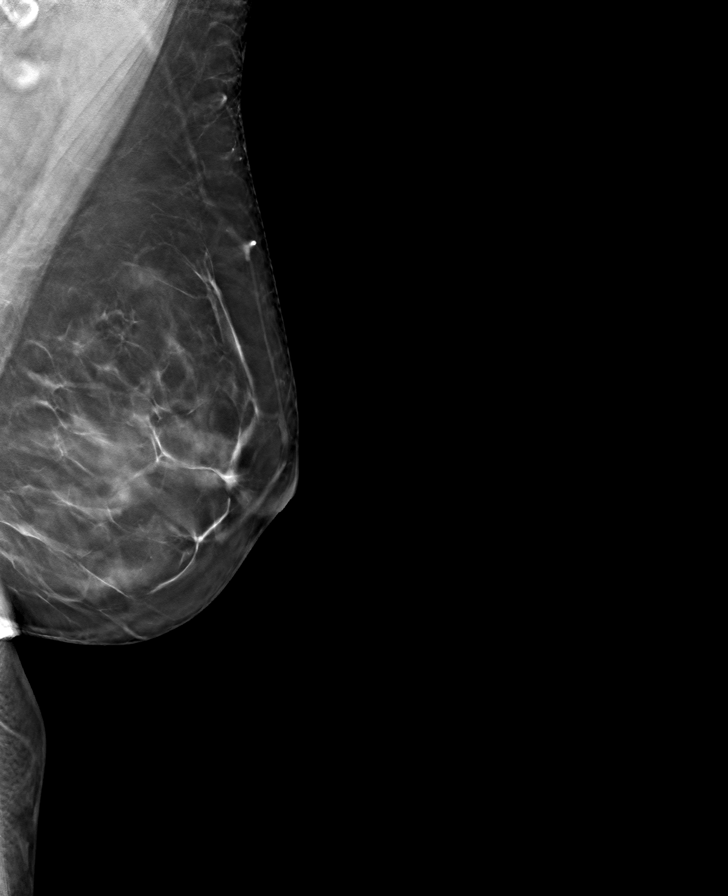

[8 of 24 positions shown; findings below may reference images not displayed]

ACR Breast Density Category c: The breast tissue is heterogeneously
dense, which may obscure small masses.
FINDINGS: Right breast: No suspicious mass, distortion, or microcalcifications
are identified to suggest presence of malignancy.

Left breast: No suspicious mass, distortion, or microcalcifications
are identified to suggest presence of malignancy.
IMPRESSION: No mammographic evidence of malignancy bilaterally or other finding
to explain the patient's diffuse intermittent breast pain.

RECOMMENDATION:
1. Clinical follow-up as needed for the bilateral breast pain.
Breast pain is a common condition, which will often resolve on its
own without intervention. It can be affected by hormonal changes,
medication side effect, weight changes and fit of the bra. Pain may
also be referred from other adjacent areas of the body. Breast pain
may be improved by wearing adequate well-fitting support,
over-the-counter topical and oral NSAID medication, low-fat diet,
and ice/heat as needed. Studies have shown an improvement in cyclic
pain with use of evening primrose oil and vitamin E.

2.  Screening mammogram in one year.(Code:KF-9-JB4)

I have discussed the findings and recommendations with the patient.
If applicable, a reminder letter will be sent to the patient
regarding the next appointment.

BI-RADS CATEGORY  1: Negative.
